# Patient Record
Sex: Female | Born: 1955 | Race: White | Hispanic: No | Smoking: Never smoker
Health system: Southern US, Community
[De-identification: ages and names within clinical notes are randomized; demographics above are authoritative.]

## PROBLEM LIST (undated history)

## (undated) DIAGNOSIS — K219 Gastro-esophageal reflux disease without esophagitis: Secondary | ICD-10-CM

## (undated) DIAGNOSIS — H81399 Other peripheral vertigo, unspecified ear: Secondary | ICD-10-CM

## (undated) DIAGNOSIS — R519 Headache, unspecified: Secondary | ICD-10-CM

## (undated) DIAGNOSIS — G250 Essential tremor: Secondary | ICD-10-CM

## (undated) DIAGNOSIS — R51 Headache: Secondary | ICD-10-CM

## (undated) DIAGNOSIS — IMO0002 Reserved for concepts with insufficient information to code with codable children: Secondary | ICD-10-CM

## (undated) HISTORY — DX: Reserved for concepts with insufficient information to code with codable children: IMO0002

## (undated) HISTORY — DX: Headache: R51

## (undated) HISTORY — DX: Headache, unspecified: R51.9

## (undated) HISTORY — DX: Essential tremor: G25.0

---

## 2000-12-16 ENCOUNTER — Emergency Department (HOSPITAL_COMMUNITY): Admission: EM | Admit: 2000-12-16 | Discharge: 2000-12-16 | Payer: Self-pay | Admitting: Emergency Medicine

## 2002-08-08 ENCOUNTER — Other Ambulatory Visit: Admission: RE | Admit: 2002-08-08 | Discharge: 2002-08-08 | Payer: Self-pay | Admitting: Obstetrics and Gynecology

## 2003-08-13 ENCOUNTER — Other Ambulatory Visit: Admission: RE | Admit: 2003-08-13 | Discharge: 2003-08-13 | Payer: Self-pay | Admitting: Obstetrics and Gynecology

## 2004-08-13 ENCOUNTER — Other Ambulatory Visit: Admission: RE | Admit: 2004-08-13 | Discharge: 2004-08-13 | Payer: Self-pay | Admitting: Obstetrics and Gynecology

## 2005-08-22 DIAGNOSIS — R87619 Unspecified abnormal cytological findings in specimens from cervix uteri: Secondary | ICD-10-CM

## 2005-08-22 DIAGNOSIS — IMO0002 Reserved for concepts with insufficient information to code with codable children: Secondary | ICD-10-CM

## 2005-08-22 HISTORY — DX: Unspecified abnormal cytological findings in specimens from cervix uteri: R87.619

## 2005-08-22 HISTORY — DX: Reserved for concepts with insufficient information to code with codable children: IMO0002

## 2005-09-06 ENCOUNTER — Other Ambulatory Visit: Admission: RE | Admit: 2005-09-06 | Discharge: 2005-09-06 | Payer: Self-pay | Admitting: Obstetrics & Gynecology

## 2006-04-10 ENCOUNTER — Other Ambulatory Visit: Admission: RE | Admit: 2006-04-10 | Discharge: 2006-04-10 | Payer: Self-pay | Admitting: Obstetrics and Gynecology

## 2006-08-22 DIAGNOSIS — G250 Essential tremor: Secondary | ICD-10-CM

## 2006-08-22 HISTORY — DX: Essential tremor: G25.0

## 2006-09-28 ENCOUNTER — Other Ambulatory Visit: Admission: RE | Admit: 2006-09-28 | Discharge: 2006-09-28 | Payer: Self-pay | Admitting: Obstetrics and Gynecology

## 2007-10-01 ENCOUNTER — Other Ambulatory Visit: Admission: RE | Admit: 2007-10-01 | Discharge: 2007-10-01 | Payer: Self-pay | Admitting: Obstetrics and Gynecology

## 2008-10-01 ENCOUNTER — Other Ambulatory Visit: Admission: RE | Admit: 2008-10-01 | Discharge: 2008-10-01 | Payer: Self-pay | Admitting: Obstetrics and Gynecology

## 2009-01-30 HISTORY — PX: COLONOSCOPY: SHX174

## 2010-11-25 ENCOUNTER — Emergency Department (INDEPENDENT_AMBULATORY_CARE_PROVIDER_SITE_OTHER): Payer: 59

## 2010-11-25 ENCOUNTER — Emergency Department (HOSPITAL_BASED_OUTPATIENT_CLINIC_OR_DEPARTMENT_OTHER)
Admission: EM | Admit: 2010-11-25 | Discharge: 2010-11-25 | Disposition: A | Discharge status: Home / Self Care | Payer: 59 | Attending: Emergency Medicine | Admitting: Emergency Medicine

## 2010-11-25 DIAGNOSIS — R42 Dizziness and giddiness: Secondary | ICD-10-CM

## 2010-11-25 DIAGNOSIS — H8110 Benign paroxysmal vertigo, unspecified ear: Secondary | ICD-10-CM | POA: Insufficient documentation

## 2010-11-25 DIAGNOSIS — R51 Headache: Secondary | ICD-10-CM | POA: Insufficient documentation

## 2010-12-06 ENCOUNTER — Other Ambulatory Visit: Payer: Self-pay | Admitting: Family Medicine

## 2010-12-06 DIAGNOSIS — M542 Cervicalgia: Secondary | ICD-10-CM

## 2010-12-08 ENCOUNTER — Ambulatory Visit
Admission: RE | Admit: 2010-12-08 | Discharge: 2010-12-08 | Disposition: A | Discharge status: Home / Self Care | Payer: 59 | Source: Ambulatory Visit | Attending: Family Medicine | Admitting: Family Medicine

## 2010-12-08 DIAGNOSIS — M542 Cervicalgia: Secondary | ICD-10-CM

## 2012-03-04 ENCOUNTER — Emergency Department (HOSPITAL_COMMUNITY): Payer: 59

## 2012-03-04 ENCOUNTER — Emergency Department (HOSPITAL_COMMUNITY)
Admission: EM | Admit: 2012-03-04 | Discharge: 2012-03-04 | Disposition: A | Discharge status: Home / Self Care | Payer: 59 | Attending: Emergency Medicine | Admitting: Emergency Medicine

## 2012-03-04 ENCOUNTER — Encounter (HOSPITAL_COMMUNITY): Payer: Self-pay | Admitting: Emergency Medicine

## 2012-03-04 DIAGNOSIS — F411 Generalized anxiety disorder: Secondary | ICD-10-CM | POA: Insufficient documentation

## 2012-03-04 DIAGNOSIS — R0602 Shortness of breath: Secondary | ICD-10-CM | POA: Insufficient documentation

## 2012-03-04 DIAGNOSIS — F419 Anxiety disorder, unspecified: Secondary | ICD-10-CM

## 2012-03-04 DIAGNOSIS — R064 Hyperventilation: Secondary | ICD-10-CM

## 2012-03-04 HISTORY — DX: Other peripheral vertigo, unspecified ear: H81.399

## 2012-03-04 LAB — POCT I-STAT TROPONIN I: Troponin i, poc: 0 ng/mL (ref 0.00–0.08)

## 2012-03-04 LAB — POCT I-STAT, CHEM 8
BUN: 17 mg/dL (ref 6–23)
Calcium, Ion: 1.15 mmol/L (ref 1.12–1.23)
Chloride: 111 meq/L (ref 96–112)
Creatinine, Ser: 0.8 mg/dL (ref 0.50–1.10)
Glucose, Bld: 97 mg/dL (ref 70–99)
HCT: 40 % (ref 36.0–46.0)
Hemoglobin: 13.6 g/dL (ref 12.0–15.0)
Potassium: 3.5 meq/L (ref 3.5–5.1)
Sodium: 142 meq/L (ref 135–145)
TCO2: 17 mmol/L (ref 0–100)

## 2012-03-04 MED ORDER — ALPRAZOLAM 0.5 MG PO TABS: 0.5000 mg | ORAL_TABLET | Freq: Three times a day (TID) | ORAL | Status: AC | PRN

## 2012-03-04 MED ORDER — LORAZEPAM 2 MG/ML IJ SOLN
1.0000 mg | Freq: Once | INTRAMUSCULAR | Status: AC
  Administered 2012-03-04: 1 mg via INTRAVENOUS
  Filled 2012-03-04: qty 1
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
## 2012-03-04 NOTE — ED Notes (Signed)
Family at bedside. 

## 2012-03-04 NOTE — ED Notes (Signed)
Patient is resting comfortably, w/no complaints, (R) hand tremors noted only when extending the hand

## 2012-03-04 NOTE — Discharge Instructions (Signed)
 Hyperventilation Syndrome Hyperventilation is fast and shallow breathing. This type of breathing may actually make you feel breathless. This type of breathing may make you think you have to catch your breath. Because of feeling like you cannot get enough air, you have been breathing more than normal (overbreathing). You are blowing off too much carbon dioxide which changes your acid-base balance. This leads to the tingling and numb feelings in your fingers, toes, and around the mouth. If this continues your fingers, hands and toes may go into spasm and curl up. You may feel as though you are going to die. Hyperventilation is usually associated with anxiety and panicky feelings. Often feelings of panic and rapid breathing become a vicious cycle. Panic leads to rapid breathing while breathing rapidly can make you feel panicky.  If you hyperventilate you may not even be aware of it. Instead you may be aware of the associated symptoms including:  Chest pains.   Palpitations.   Dizziness.   Lightheadedness.   Dry mouth.   Weakness.   Confusion.   Tingling sensations.   Sleep disturbance.  One common question is, Why did this happen to me while I was sleeping. I was not anxious. The answer is unknown. One bernadette is that it may occur during dream activity. It may disturb you more when it wakes you from a sound sleep. CAUSES  Although anxiety and/or panic attacks are some of the most common causes for the syndrome, other causes include:  Nervousness.   Stress.   Stimulant use.   Lung Disease.   Infections like pneumonia.   Heart problems.   Severe pain.   Waking from a bad dream.   Drugs or alcohol.   Pregnancy   Bleeding.  TREATMENT  Re-breathing into a paper bag (NOT plastic) or mask with a tube on it in the hospital or ambulance physically changes the carbon dioxide level. The breathing rate will slow down. The numbness and tingling will slowly go away. You might still  feel shaky.   If anxiety or panic persist, a therapist or psychiatrist may be helpful in understanding and treating the condition.   Learn breathing exercises that help you breathe from your diaphragm and abdomen.   Practice relaxation techniques such as visualization, meditation, and muscle release.  Document Released: 08/05/2000 Document Revised: 07/28/2011 Document Reviewed: 05/26/2009 [facility] Patient Information 2012 [place], [place].   Narcotic and benzodiazepine use may cause drowsiness, slowed breathing or dependence.  Please use with caution and do not drive, operate machinery or watch young children alone while taking them.  Taking combinations of these medications or drinking alcohol will potentiate these effects.

## 2012-03-04 NOTE — ED Notes (Signed)
Pt reports waking 0100 this am w/(R) arm tingling and "shaking," (L) side chest pressure, and sob. Pt denies N/V, chest congestion, cough, dizziness, headaches, or diaphoresis. Pt denies any hx of the same.

## 2012-03-04 NOTE — ED Notes (Signed)
Per EMS pt from home, pt reports waking 0100 this am w/mid-sternum chest pain and (R) arm tingling and "shaking." Pt denies sob, N/V, dizziness. Reports her pain a 4/10 w/no radiation, tender w/palpation. 20 g RH, received 25 cc NS en route, ASA 324 mg and nitro sl x1 w/no relief in chest pain, 12 lead non remarkable, 2 L [place]

## 2012-03-04 NOTE — ED Notes (Signed)
Patient transported to X-ray 

## 2013-03-04 ENCOUNTER — Encounter: Payer: Self-pay | Admitting: *Deleted

## 2013-03-07 ENCOUNTER — Ambulatory Visit (INDEPENDENT_AMBULATORY_CARE_PROVIDER_SITE_OTHER): Payer: 59 | Admitting: Nurse Practitioner

## 2013-03-07 ENCOUNTER — Encounter: Payer: Self-pay | Admitting: Nurse Practitioner

## 2013-03-07 VITALS — BP 114/70 | HR 74 | Resp 14 | Ht 62.5 in | Wt 135.6 lb

## 2013-03-07 DIAGNOSIS — Z0000 Encounter for general adult medical examination without abnormal findings: Secondary | ICD-10-CM

## 2013-03-07 DIAGNOSIS — Z01419 Encounter for gynecological examination (general) (routine) without abnormal findings: Secondary | ICD-10-CM

## 2013-03-07 MED ORDER — ESTRADIOL-NORETHINDRONE ACET 1-0.5 MG PO TABS: 1.0000 | ORAL_TABLET | Freq: Every day | ORAL | Status: DC

## 2013-03-07 NOTE — Progress Notes (Signed)
57 y.o. G66P4 Widowed Caucasian Fe here for annual exam.   Now being treated for OAB and on Toviaz and currently on Septra for UTI. Is being follower by Urologist who wants her to continue with HRT because of urinary issues. Not sexually active in years since husbands death in 02-13-04 from MI.  No LMP recorded. Patient is postmenopausal.          Sexually active: no  The current method of family planning is OCP (estrogen/progesterone).    Exercising: yes  walking Smoker:  no  Health Maintenance: Pap:  03/06/2012  Normal with negative HR HPV MMG:  07/24/2012 normal Colonoscopy:  01/30/2009 normal BMD:   never TDaP:  10/01/2008 Labs: Hgb-13.4    reports that she has never smoked. She has never used smokeless tobacco. She reports that she does not drink alcohol or use illicit drugs.  Past Medical History  Diagnosis Date  . Peripheral vertigo   . Abnormal Pap smear     CIN I  . Bilateral headaches     Past Surgical History  Procedure Laterality Date  . Benign essential tremor    . Cesarean section      x4    Current Outpatient Prescriptions  Medication Sig Dispense Refill  . ALPRAZolam (XANAX) 0.5 MG tablet Take 0.5 mg by mouth at bedtime as needed for sleep.      . norethindrone-ethinyl estradiol (JUNEL FE,GILDESS FE,LOESTRIN FE) 1-20 MG-MCG tablet Take 1 tablet by mouth daily.      Marland Kitchen sulfamethoxazole-trimethoprim (BACTRIM DS) 800-160 MG per tablet       . TOVIAZ 8 MG TB24        No current facility-administered medications for this visit.    Family History  Problem Relation Age of Onset  . Coronary artery disease Father   . Heart failure Father   . Heart failure Other   . Stroke Other     ROS:  Pertinent items are noted in HPI.  Otherwise, a comprehensive ROS was negative.  Exam:   BP 114/70  Pulse 74  Resp 14  Ht 5' 2.5" (1.588 m)  Wt 135 lb 9.6 oz (61.508 kg)  BMI 24.39 kg/m2 Height: 5' 2.5" (158.8 cm)  Ht Readings from Last 3 Encounters:  03/07/13 5' 2.5"  (1.588 m)    General appearance: alert, cooperative and appears stated age Head: Normocephalic, without obvious abnormality, atraumatic Neck: no adenopathy, supple, symmetrical, trachea midline and thyroid normal to inspection and palpation Lungs: clear to auscultation bilaterally Breasts: normal appearance, no masses or tenderness Heart: regular rate and rhythm Abdomen: soft, non-tender; no masses,  no organomegaly Extremities: extremities normal, atraumatic, no cyanosis or edema Skin: Skin color, texture, turgor normal. No rashes or lesions Lymph nodes: Cervical, supraclavicular, and axillary nodes normal. No abnormal inguinal nodes palpated Neurologic: Grossly normal   Pelvic: External genitalia:  no lesions              Urethra:  normal appearing urethra with no masses, tenderness or lesions              Bartholin's and Skene's: normal                 Vagina: normal appearing vagina with normal color and discharge, no lesions              Cervix: anteverted              Pap taken: no Bimanual Exam:  Uterus:  normal size, contour, position,  consistency, mobility, non-tender              Adnexa: no mass, fullness, tenderness               Rectovaginal: Confirms               Anus:  normal sphincter tone, no lesions  A:  Well Woman with normal exam  Postmenopausal on HRT  OAB with current UTI - Urologist is following   P:   Pap smear as per guidelines   Mammogram due 07/2013  Refill HRT Activella for 1 year Rx printed because she has a new mail order  company and doesn't know the name.  Reviewed potentia risk including DVT, CVA,cancer, etc  Routine labs and follow  counseled on use and side effects of HRT, adequate intake of calcium and  vitamin D, diet and exercise, Kegel's exercises return annually or prn  An After Visit Summary was printed and given to the patient.

## 2013-03-07 NOTE — Patient Instructions (Addendum)

## 2013-03-08 LAB — COMPREHENSIVE METABOLIC PANEL
ALT: 10 U/L (ref 0–35)
Alkaline Phosphatase: 48 U/L (ref 39–117)
Creat: 1.12 mg/dL — ABNORMAL HIGH (ref 0.50–1.10)
Glucose, Bld: 94 mg/dL (ref 70–99)
Sodium: 139 mEq/L (ref 135–145)
Total Bilirubin: 0.4 mg/dL (ref 0.3–1.2)
Total Protein: 6.6 g/dL (ref 6.0–8.3)

## 2013-03-08 LAB — LIPID PANEL
HDL: 44 mg/dL (ref >39)
LDL Cholesterol: 109 mg/dL — ABNORMAL HIGH (ref 0–99)
Total CHOL/HDL Ratio: 3.9 Ratio
Triglycerides: 85 mg/dL (ref <150)

## 2013-03-08 LAB — TSH: TSH: 2.675 u[IU]/mL (ref 0.350–4.500)

## 2013-03-08 NOTE — Progress Notes (Signed)
Encounter reviewed by Dr. Senovia Gauer Silva.  

## 2013-03-11 ENCOUNTER — Telehealth: Payer: Self-pay | Admitting: *Deleted

## 2013-03-11 NOTE — Telephone Encounter (Signed)
Pt is aware of all lab results and will discontinue vitamin D supplements.

## 2013-03-11 NOTE — Telephone Encounter (Signed)
Message copied by Osie Bond on Mon Mar 11, 2013  8:51 AM ------      Message from: Ria Comment R      Created: Mon Mar 11, 2013  8:36 AM       Let patient know that labs are normal. Vit D is elevated at 72 needs to come off Vit D for the summer if she is taking. TSH is normal ------

## 2014-03-11 ENCOUNTER — Ambulatory Visit (INDEPENDENT_AMBULATORY_CARE_PROVIDER_SITE_OTHER): Payer: 59 | Admitting: Nurse Practitioner

## 2014-03-11 ENCOUNTER — Encounter: Payer: Self-pay | Admitting: Nurse Practitioner

## 2014-03-11 VITALS — BP 120/70 | HR 64 | Resp 18 | Ht 62.75 in | Wt 134.0 lb

## 2014-03-11 DIAGNOSIS — R8290 Unspecified abnormal findings in urine: Secondary | ICD-10-CM

## 2014-03-11 DIAGNOSIS — Z1211 Encounter for screening for malignant neoplasm of colon: Secondary | ICD-10-CM

## 2014-03-11 DIAGNOSIS — E2839 Other primary ovarian failure: Secondary | ICD-10-CM

## 2014-03-11 DIAGNOSIS — Z01419 Encounter for gynecological examination (general) (routine) without abnormal findings: Secondary | ICD-10-CM

## 2014-03-11 DIAGNOSIS — L578 Other skin changes due to chronic exposure to nonionizing radiation: Secondary | ICD-10-CM

## 2014-03-11 DIAGNOSIS — Z0000 Encounter for general adult medical examination without abnormal findings: Secondary | ICD-10-CM

## 2014-03-11 DIAGNOSIS — R82998 Other abnormal findings in urine: Secondary | ICD-10-CM

## 2014-03-11 LAB — POCT URINALYSIS DIPSTICK
Bilirubin, UA: NEGATIVE
Glucose, UA: NEGATIVE
KETONES UA: NEGATIVE
Nitrite, UA: NEGATIVE
PH UA: 5
PROTEIN UA: NEGATIVE
Urobilinogen, UA: NEGATIVE

## 2014-03-11 LAB — HEMOGLOBIN, FINGERSTICK: Hemoglobin, fingerstick: 13.8 g/dL (ref 12.0–16.0)

## 2014-03-11 MED ORDER — FLUCONAZOLE 150 MG PO TABS: 150.0000 mg | ORAL_TABLET | Freq: Once | ORAL | Status: DC

## 2014-03-11 MED ORDER — ESTRADIOL-NORETHINDRONE ACET 1-0.5 MG PO TABS: 1.0000 | ORAL_TABLET | Freq: Every day | ORAL | Status: DC

## 2014-03-11 NOTE — Patient Instructions (Signed)
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
## 2014-03-12 LAB — URINALYSIS, MICROSCOPIC ONLY: CASTS: NONE SEEN

## 2014-03-12 LAB — URINE CULTURE: Colony Count: >=100000

## 2014-03-13 ENCOUNTER — Other Ambulatory Visit: Payer: Self-pay | Admitting: Nurse Practitioner

## 2014-03-14 ENCOUNTER — Telehealth: Payer: Self-pay

## 2014-03-14 NOTE — Telephone Encounter (Signed)
Message copied by Brooks SailorsHARGROVE, Donnis Phaneuf S on Fri Mar 14, 2014 10:03 AM ------      Message from: Verner CholLEONARD, DEBORAH S      Created: Fri Mar 14, 2014  8:40 AM       Notify patient that urine micro showed very few bacteria and feel related to yeast in vagina which you were given medication for. Urine micro showed multibacteria non predominant to treat. If UTI symptoms occur need to notify. Needs repeat micro and culture in one week after Diflucan use. Order in. ------

## 2014-03-14 NOTE — Telephone Encounter (Signed)
Called pt but no vm or no answer Will try later

## 2014-03-14 NOTE — Telephone Encounter (Signed)
Last AEX; 03/11/14 Last refill:03/11/14 #90, 3 rf Current AEX:03/13/15  Pt was given a rx on 03/11/14

## 2014-03-17 MED ORDER — ESTRADIOL-NORETHINDRONE ACET 1-0.5 MG PO TABS: 1.0000 | ORAL_TABLET | Freq: Every day | ORAL | Status: DC

## 2014-03-17 NOTE — Telephone Encounter (Signed)
S/W pharmacy and she stated she never received the rx.  I'm resending the rx.

## 2014-03-17 NOTE — Telephone Encounter (Signed)
Pt informed of results below. Pt stated that pharmacy needed more info about rx that was sent on 03/11/14

## 2014-03-20 NOTE — Progress Notes (Signed)
Encounter reviewed by Dr. Brook Silva.  

## 2014-04-04 LAB — FECAL OCCULT BLOOD, IMMUNOCHEMICAL: IMMUNOLOGICAL FECAL OCCULT BLOOD TEST: NEGATIVE

## 2014-04-04 NOTE — Addendum Note (Signed)
Addended by: Luisa DagoPHILLIPS, STEPHANIE C on: 04/04/2014 10:33 AM   Modules accepted: Orders

## 2014-04-07 ENCOUNTER — Telehealth: Payer: Self-pay

## 2014-04-07 NOTE — Telephone Encounter (Signed)
Pt informed of results and voiced understanding

## 2014-04-07 NOTE — Telephone Encounter (Signed)
Message copied by Brooks SailorsHARGROVE, Tashi Andujo S on Mon Apr 07, 2014 11:12 AM ------      Message from: Verner CholLEONARD, DEBORAH S      Created: Fri Apr 04, 2014  3:04 PM       DL for PG      Notify fecal occult blood test negative ------

## 2014-04-17 ENCOUNTER — Other Ambulatory Visit: Payer: Self-pay

## 2014-04-17 NOTE — Telephone Encounter (Signed)
Fax from [facility] Last AEX: 721/15 Last refill:03/17/14 #90 X 3 Current AEX:03/13/15  New Rx sent 03/11/14

## 2014-04-21 ENCOUNTER — Other Ambulatory Visit: Payer: Self-pay

## 2014-04-21 ENCOUNTER — Telehealth: Payer: Self-pay | Admitting: Nurse Practitioner

## 2014-04-21 MED ORDER — ESTRADIOL-NORETHINDRONE ACET 1-0.5 MG PO TABS: 1.0000 | ORAL_TABLET | Freq: Every day | ORAL | Status: DC

## 2014-04-21 NOTE — Telephone Encounter (Signed)
Ronke from [employer] from regarding pt's Rx for Activella.

## 2014-04-21 NOTE — Telephone Encounter (Signed)
Received a fax from [facility]. They stated they did not get the rx sent on 03/17/14. Resent rx to [facility]. Encounter closed

## 2014-04-21 NOTE — Telephone Encounter (Signed)
Spoke with pharmacist at [facility] and advised that new rx was faxed.  She will process rx.  Routing to provider for final review. Patient agreeable to disposition. Will close encounter

## 2014-06-23 ENCOUNTER — Encounter: Payer: Self-pay | Admitting: Nurse Practitioner

## 2015-03-13 ENCOUNTER — Ambulatory Visit: Payer: 59 | Admitting: Nurse Practitioner

## 2015-03-16 ENCOUNTER — Encounter: Payer: Self-pay | Admitting: Nurse Practitioner

## 2015-03-16 ENCOUNTER — Ambulatory Visit (INDEPENDENT_AMBULATORY_CARE_PROVIDER_SITE_OTHER): Payer: 59 | Admitting: Nurse Practitioner

## 2015-03-16 VITALS — BP 112/64 | HR 68 | Ht 62.75 in | Wt 137.0 lb

## 2015-03-16 DIAGNOSIS — Z0000 Encounter for general adult medical examination without abnormal findings: Secondary | ICD-10-CM

## 2015-03-16 DIAGNOSIS — E7800 Pure hypercholesterolemia, unspecified: Secondary | ICD-10-CM

## 2015-03-16 DIAGNOSIS — Z1211 Encounter for screening for malignant neoplasm of colon: Secondary | ICD-10-CM | POA: Diagnosis not present

## 2015-03-16 DIAGNOSIS — B372 Candidiasis of skin and nail: Secondary | ICD-10-CM | POA: Diagnosis not present

## 2015-03-16 DIAGNOSIS — Z01419 Encounter for gynecological examination (general) (routine) without abnormal findings: Secondary | ICD-10-CM | POA: Diagnosis not present

## 2015-03-16 DIAGNOSIS — N390 Urinary tract infection, site not specified: Secondary | ICD-10-CM

## 2015-03-16 LAB — HEMOGLOBIN, FINGERSTICK: Hemoglobin, fingerstick: 13.9 g/dL (ref 12.0–16.0)

## 2015-03-16 MED ORDER — FLUCONAZOLE 150 MG PO TABS: ORAL_TABLET | ORAL | Status: DC

## 2015-03-16 NOTE — Patient Instructions (Signed)

## 2015-03-16 NOTE — Progress Notes (Signed)
Patient ID: Kristi Gamble, female   DOB: 09-30-1955, 59 y.o.   MRN: [id number] 59 y.o. G4P4003 Widowed  Caucasian Fe here for annual exam. Currently being treated for     UTI since Friday.  She is now on Cipro, culture was done and should be called soon.  She states only feels a little better - maybe related to the antibiotic or the dose is not strong enough.  No history of renal calculi.  Patient's last menstrual period was 06/22/2008 (approximate).          Sexually active: No. not since husbands death in 01-17-2004 The current method of family planning is abstinence and post menopausal status.    Exercising: Yes.    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 BMD: N/A TDaP: 10/01/08 Labs:  HB:  13.9  Urine:  Not tested, pt is currently being treated for UTI   reports that she has never smoked. She has never used smokeless tobacco. She reports that she does not drink alcohol or use illicit drugs.  Past Medical History  Diagnosis Date  . Peripheral vertigo   . Abnormal Pap smear 08/2005    CIN I  . Bilateral headaches   . Benign essential tremor 01-17-2007    Past Surgical History  Procedure Laterality Date  . Cesarean section      x4  . Colonoscopy  01/30/09    normal recxheck in 10 years    Current Outpatient Prescriptions  Medication Sig Dispense Refill  . ciprofloxacin (CIPRO) 250 MG tablet Take 1 tablet by mouth 2 (two) times daily.    . fluconazole (DIFLUCAN) 150 MG tablet Take one tablet and Repeat in 48 hours.  Then weekly X 4 6 tablet 0   No current facility-administered medications for this visit.    Family History  Problem Relation Age of Onset  . Coronary artery disease Father   . Heart failure Father   . Heart failure Maternal  Grandmother   . Stroke Paternal Grandfather     ROS:  Pertinent items are noted in HPI.  Otherwise, a comprehensive ROS was negative.  Exam:   BP 112/64 mmHg  Pulse 68  Ht 5' 2.75" (1.594 m)  Wt 137 lb (62.143 kg)  BMI 24.46 kg/m2  LMP 06/22/2008 (Approximate) Height: 5' 2.75" (159.4 cm) Ht Readings from Last 3 Encounters:  03/16/15 5' 2.75" (1.594 m)  03/11/14 5' 2.75" (1.594 m)  03/07/13 5' 2.5" (1.588 m)    General appearance: alert, cooperative and appears stated age Head: Normocephalic, without obvious abnormality, atraumatic Neck: no adenopathy, supple, symmetrical, trachea midline and thyroid normal to inspection and palpation Lungs: clear to auscultation bilaterally Breasts: normal appearance, no masses or tenderness Heart: regular rate and rhythm Abdomen: soft, non-tender; no masses,  no organomegaly Extremities: extremities normal, atraumatic, no cyanosis or edema Skin: Skin color, texture, turgor normal. No rashes or lesions - skin is sun damaged Lymph nodes: Cervical, supraclavicular, and axillary nodes normal. No abnormal inguinal nodes palpated Neurologic: Grossly normal   Pelvic: External genitalia:  Chronic irritation of vulvar yeast extending to the rectum              Urethra:  normal appearing urethra with no masses, tenderness or lesions  Bartholin's and Skene's: normal                 Vagina: normal appearing vagina with normal color and discharge, no lesions              Cervix: anteverted              Pap taken: Yes.   Bimanual Exam:  Uterus:  normal size, contour, position, consistency, mobility, non-tender              Adnexa: no mass, fullness, tenderness               Rectovaginal: Confirms               Anus:  normal sphincter tone, no lesions        Chaperone present:  yes  A:  Well Woman with normal exam  Postmenopausal on HRT Not SA Current  UTI  Sun damaged skin   Remote History of CIN I 2007  External chronic yeast- due to out of doors working most days   P:   Reviewed health and wellness pertinent to exam  Pap smear as above  Mammogram is due 12/2015  Will give her Diflucan 150 mg X 2 initially , then follow with 1 tablet weekly X 4 to treat this yeast infection.  Will  return for fasting labs, IFOB is given  Counseled on breast self exam, mammography screening, adequate intake of calcium and vitamin D, diet and exercise, Kegel's exercises return annually or prn  An After Visit Summary was printed and given to the patient.

## 2015-03-19 LAB — IPS PAP TEST WITH HPV

## 2015-03-21 NOTE — Progress Notes (Signed)
Encounter reviewed by Dr. Brook Amundson C. Silva.  

## 2015-03-24 ENCOUNTER — Other Ambulatory Visit (INDEPENDENT_AMBULATORY_CARE_PROVIDER_SITE_OTHER): Payer: 59

## 2015-03-24 DIAGNOSIS — Z0000 Encounter for general adult medical examination without abnormal findings: Secondary | ICD-10-CM

## 2015-03-25 LAB — CBC WITH DIFFERENTIAL/PLATELET
BASOS PCT: 1 % (ref 0–1)
Basophils Absolute: 0 10*3/uL (ref 0.0–0.1)
EOS ABS: 0.1 10*3/uL (ref 0.0–0.7)
EOS PCT: 3 % (ref 0–5)
HCT: 43.1 % (ref 36.0–46.0)
HEMOGLOBIN: 14.5 g/dL (ref 12.0–15.0)
LYMPHS ABS: 1.6 10*3/uL (ref 0.7–4.0)
Lymphocytes Relative: 34 % (ref 12–46)
MCH: 31.9 pg (ref 26.0–34.0)
MCHC: 33.6 g/dL (ref 30.0–36.0)
MCV: 94.7 fL (ref 78.0–100.0)
MPV: 9.5 fL (ref 8.6–12.4)
Monocytes Absolute: 0.3 10*3/uL (ref 0.1–1.0)
Monocytes Relative: 7 % (ref 3–12)
Neutro Abs: 2.6 10*3/uL (ref 1.7–7.7)
Neutrophils Relative %: 55 % (ref 43–77)
Platelets: 303 10*3/uL (ref 150–400)
RBC: 4.55 MIL/uL (ref 3.87–5.11)
RDW: 13.8 % (ref 11.5–15.5)
WBC: 4.7 10*3/uL (ref 4.0–10.5)

## 2015-03-25 LAB — TSH: TSH: 2.463 u[IU]/mL (ref 0.350–4.500)

## 2015-03-25 LAB — COMPREHENSIVE METABOLIC PANEL
ALT: 13 U/L (ref 6–29)
AST: 16 U/L (ref 10–35)
Albumin: 4 g/dL (ref 3.6–5.1)
Alkaline Phosphatase: 50 U/L (ref 33–130)
BUN: 21 mg/dL (ref 7–25)
CO2: 24 mmol/L (ref 20–31)
Calcium: 9.6 mg/dL (ref 8.6–10.4)
Chloride: 105 mmol/L (ref 98–110)
Creat: 0.87 mg/dL (ref 0.50–0.99)
GLUCOSE: 86 mg/dL (ref 65–99)
Potassium: 4.3 mmol/L (ref 3.5–5.3)
Sodium: 141 mmol/L (ref 135–146)
TOTAL PROTEIN: 7.1 g/dL (ref 6.1–8.1)
Total Bilirubin: 0.5 mg/dL (ref 0.2–1.2)

## 2015-03-25 LAB — LIPID PANEL
CHOL/HDL RATIO: 3.6 ratio (ref <=5.0)
Cholesterol: 178 mg/dL (ref 125–200)
HDL: 50 mg/dL (ref >=46)
LDL CALC: 110 mg/dL (ref <130)
TRIGLYCERIDES: 92 mg/dL (ref <150)
VLDL: 18 mg/dL (ref <30)

## 2015-03-25 LAB — VITAMIN D 25 HYDROXY (VIT D DEFICIENCY, FRACTURES): Vit D, 25-Hydroxy: 60 ng/mL (ref 30–100)

## 2016-02-16 ENCOUNTER — Encounter: Payer: Self-pay | Admitting: Nurse Practitioner

## 2016-03-18 ENCOUNTER — Ambulatory Visit (INDEPENDENT_AMBULATORY_CARE_PROVIDER_SITE_OTHER): Payer: BLUE CROSS/BLUE SHIELD | Admitting: Nurse Practitioner

## 2016-03-18 ENCOUNTER — Encounter: Payer: Self-pay | Admitting: Nurse Practitioner

## 2016-03-18 VITALS — BP 114/72 | HR 64 | Ht 62.75 in | Wt 142.0 lb

## 2016-03-18 DIAGNOSIS — N760 Acute vaginitis: Secondary | ICD-10-CM

## 2016-03-18 DIAGNOSIS — Z0000 Encounter for general adult medical examination without abnormal findings: Secondary | ICD-10-CM

## 2016-03-18 DIAGNOSIS — Z79890 Hormone replacement therapy (postmenopausal): Secondary | ICD-10-CM

## 2016-03-18 DIAGNOSIS — Z01419 Encounter for gynecological examination (general) (routine) without abnormal findings: Secondary | ICD-10-CM | POA: Diagnosis not present

## 2016-03-18 DIAGNOSIS — Z1211 Encounter for screening for malignant neoplasm of colon: Secondary | ICD-10-CM

## 2016-03-18 MED ORDER — FLUCONAZOLE 150 MG PO TABS: 150.0000 mg | ORAL_TABLET | Freq: Once | ORAL | 1 refills | Status: AC

## 2016-03-18 MED ORDER — ESTRADIOL-NORETHINDRONE ACET 1-0.5 MG PO TABS: 1.0000 | ORAL_TABLET | Freq: Every day | ORAL | 1 refills | Status: DC

## 2016-03-18 NOTE — Progress Notes (Signed)
60 y.o. G21P4003 Widowed  Caucasian Fe here for annual exam.  Off HRT since last fall.  Initially did OK but now not so good.  Has had increase in vaso symptoms that are "unbearable" at times.  Some insomnia and has had increase in weight.  She wants to go back on HRT even for a short time.  She is still working and is now looking into being able to collect as a widows pension.  She still takes care of about 16 acres of land at her home.  By working out side she feels she has another yeast infection.  Patient's last menstrual period was 06/22/2008 (approximate).          Sexually active: No. not since death of her husband The current method of family planning is none.    Exercising: Yes.    walking daily Smoker:  no  Health Maintenance: Pap:  03/16/15, Negative with neg HR HPV. CIN I 2007 MMG:  02/02/16, Bi-Rads 1:  Negative Colonoscopy: 01/2009 Normal repeat in 10 years BMD: none TDaP:  10/01/2008 Shingles: never Hep C and HIV: will do with other labs fasting Labs: will return for fasting labs   reports that she has never smoked. She has never used smokeless tobacco. She reports that she does not drink alcohol or use drugs.  Past Medical History:  Diagnosis Date  . 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 Benign essential tremor 2008  . Bilateral headaches   . Peripheral vertigo     Past Surgical History:  Procedure Laterality Date  . CESAREAN SECTION     x4  . COLONOSCOPY  01/30/09   normal recxheck in 10 years    No current outpatient prescriptions on file.   No current facility-administered medications for this visit.     Family History  Problem Relation Age of Onset  . Coronary artery disease Father   . Heart failure Father   . Stroke Paternal Grandfather   . Heart failure Maternal Grandmother     ROS:  Pertinent items are noted in HPI.  Otherwise, a comprehensive ROS was negative.  Exam:   BP 114/72 (BP Location: Right Arm, Patient Position: Sitting, Cuff Size:  Normal)   Pulse 64   Ht 5' 2.75" (1.594 m)   Wt 142 lb (64.4 kg)   LMP 06/22/2008 (Approximate)   BMI 25.36 kg/m  Height: 5' 2.75" (159.4 cm) Ht Readings from Last 3 Encounters:  03/18/16 5' 2.75" (1.594 m)  03/16/15 5' 2.75" (1.594 m)  03/11/14 5' 2.75" (1.594 m)    General appearance: alert, cooperative and appears stated age Head: Normocephalic, without obvious abnormality, atraumatic Neck: no adenopathy, supple, symmetrical, trachea midline and thyroid normal to inspection and palpation Lungs: clear to auscultation bilaterally Breasts: normal appearance, no masses or tenderness Heart: regular rate and rhythm Abdomen: soft, non-tender; no masses,  no organomegaly Extremities: extremities normal, atraumatic, no cyanosis or edema Skin: Skin color, texture, turgor normal. No rashes or lesions Lymph nodes: Cervical, supraclavicular, and axillary nodes normal. No abnormal inguinal nodes palpated Neurologic: Grossly normal   Pelvic: External genitalia:  no lesions, but external yeast symptoms that extend to the perianal region.              Urethra:  normal appearing urethra with no masses, tenderness or lesions              Bartholin's and Skene's: normal  Vagina: normal appearing vagina with normal color and discharge, no lesions              Cervix: anteverted              Pap taken: No. Bimanual Exam:  Uterus:  normal size, contour, position, consistency, mobility, non-tender              Adnexa: no mass, fullness, tenderness               Rectovaginal: Confirms               Anus:  normal sphincter tone, no lesions  Chaperone present: yes  A:  Well Woman with normal exam  Postmenopausal - tapered off HRT - wants a restart Not SA Sun damaged skin  Remote History of CIN I 2007             External chronic yeast- due to out of doors working most days     P:   Reviewed health and wellness pertinent to exam  Pap  smear not done  Mammogram is due 01/2017  Will start her back on Activella that she took before - she is given RX for 6 months and may try to taper before then.  If she is unable to taper then may need a refill later.  Counseled of risk of DVT, CVA, cancer, etc.  Will follow with Affirm - she is given Diflucan for now  Counseled on breast self exam, mammography screening, use and side effects of HRT, adequate intake of calcium and vitamin D, diet and exercise, Kegel's exercises return annually or prn  An After Visit Summary was printed and given to the patient.

## 2016-03-18 NOTE — Patient Instructions (Addendum)

## 2016-03-19 LAB — WET PREP BY MOLECULAR PROBE
CANDIDA SPECIES: NEGATIVE
GARDNERELLA VAGINALIS: NEGATIVE
TRICHOMONAS VAG: NEGATIVE

## 2016-03-22 ENCOUNTER — Other Ambulatory Visit (INDEPENDENT_AMBULATORY_CARE_PROVIDER_SITE_OTHER): Payer: BLUE CROSS/BLUE SHIELD

## 2016-03-22 DIAGNOSIS — Z0000 Encounter for general adult medical examination without abnormal findings: Secondary | ICD-10-CM

## 2016-03-22 LAB — CBC WITH DIFFERENTIAL/PLATELET
BASOS PCT: 1 %
Basophils Absolute: 59 cells/uL (ref 0–200)
EOS PCT: 3 %
Eosinophils Absolute: 177 cells/uL (ref 15–500)
HEMATOCRIT: 42.5 % (ref 35.0–45.0)
HEMOGLOBIN: 14.4 g/dL (ref 11.7–15.5)
LYMPHS ABS: 1534 {cells}/uL (ref 850–3900)
Lymphocytes Relative: 26 %
MCH: 32 pg (ref 27.0–33.0)
MCHC: 33.9 g/dL (ref 32.0–36.0)
MCV: 94.4 fL (ref 80.0–100.0)
MONO ABS: 413 {cells}/uL (ref 200–950)
MPV: 9.5 fL (ref 7.5–12.5)
Monocytes Relative: 7 %
Neutro Abs: 3717 cells/uL (ref 1500–7800)
Neutrophils Relative %: 63 %
Platelets: 304 10*3/uL (ref 140–400)
RBC: 4.5 MIL/uL (ref 3.80–5.10)
RDW: 14 % (ref 11.0–15.0)
WBC: 5.9 10*3/uL (ref 3.8–10.8)

## 2016-03-22 LAB — COMPREHENSIVE METABOLIC PANEL
ALBUMIN: 4.4 g/dL (ref 3.6–5.1)
ALK PHOS: 68 U/L (ref 33–130)
ALT: 9 U/L (ref 6–29)
AST: 15 U/L (ref 10–35)
BUN: 16 mg/dL (ref 7–25)
CALCIUM: 9.7 mg/dL (ref 8.6–10.4)
CHLORIDE: 105 mmol/L (ref 98–110)
CO2: 22 mmol/L (ref 20–31)
Creat: 0.99 mg/dL (ref 0.50–0.99)
Glucose, Bld: 92 mg/dL (ref 65–99)
POTASSIUM: 4.2 mmol/L (ref 3.5–5.3)
Sodium: 140 mmol/L (ref 135–146)
TOTAL PROTEIN: 7.4 g/dL (ref 6.1–8.1)
Total Bilirubin: 0.4 mg/dL (ref 0.2–1.2)

## 2016-03-22 LAB — TSH: TSH: 3.74 mIU/L

## 2016-03-22 LAB — LIPID PANEL
CHOL/HDL RATIO: 3.5 ratio (ref <=5.0)
CHOLESTEROL: 183 mg/dL (ref 125–200)
HDL: 52 mg/dL (ref >=46)
LDL Cholesterol: 112 mg/dL (ref <130)
TRIGLYCERIDES: 94 mg/dL (ref <150)
VLDL: 19 mg/dL (ref <30)

## 2016-03-23 LAB — VITAMIN D 25 HYDROXY (VIT D DEFICIENCY, FRACTURES): Vit D, 25-Hydroxy: 66 ng/mL (ref 30–100)

## 2016-03-23 LAB — HIV ANTIBODY (ROUTINE TESTING W REFLEX): HIV: NONREACTIVE

## 2016-03-23 LAB — HEPATITIS C ANTIBODY: HCV AB: NEGATIVE

## 2016-03-23 NOTE — Addendum Note (Signed)
Addended by: Roanna Banning on: 03/23/2016 05:47 PM   Modules accepted: Orders

## 2016-03-28 LAB — FECAL OCCULT BLOOD, IMMUNOCHEMICAL: IMMUNOLOGICAL FECAL OCCULT BLOOD TEST: NEGATIVE

## 2016-03-28 NOTE — Addendum Note (Signed)
Addended by: Michell Heinrich'NEAL, SUMMER D on: 03/28/2016 09:53 AM   Modules accepted: Orders

## 2016-09-08 ENCOUNTER — Other Ambulatory Visit: Payer: Self-pay | Admitting: Nurse Practitioner

## 2016-09-09 NOTE — Telephone Encounter (Signed)
Medication refill request: Estradiol-Norethindrone Last AEX:  03/18/16 PG Next AEX: 03/20/17 PG Last MMG (if hormonal medication request): 02/02/16 BIRADS1, Density C, [facility] Refill authorized: 03/18/16 #90 1R. Please advise. Thank you.

## 2017-02-23 ENCOUNTER — Encounter: Payer: Self-pay | Admitting: Nurse Practitioner

## 2017-03-20 ENCOUNTER — Ambulatory Visit: Payer: BLUE CROSS/BLUE SHIELD | Admitting: Nurse Practitioner

## 2017-03-22 ENCOUNTER — Encounter: Payer: Self-pay | Admitting: Certified Nurse Midwife

## 2017-03-22 ENCOUNTER — Ambulatory Visit (INDEPENDENT_AMBULATORY_CARE_PROVIDER_SITE_OTHER): Payer: BLUE CROSS/BLUE SHIELD | Admitting: Certified Nurse Midwife

## 2017-03-22 ENCOUNTER — Other Ambulatory Visit (HOSPITAL_COMMUNITY)
Admission: RE | Admit: 2017-03-22 | Discharge: 2017-03-22 | Disposition: A | Discharge status: Home / Self Care | Payer: BLUE CROSS/BLUE SHIELD | Source: Ambulatory Visit | Attending: Obstetrics & Gynecology | Admitting: Obstetrics & Gynecology

## 2017-03-22 VITALS — BP 108/64 | HR 70 | Resp 16 | Ht 62.5 in | Wt 135.0 lb

## 2017-03-22 DIAGNOSIS — Z01419 Encounter for gynecological examination (general) (routine) without abnormal findings: Secondary | ICD-10-CM

## 2017-03-22 DIAGNOSIS — Z124 Encounter for screening for malignant neoplasm of cervix: Secondary | ICD-10-CM | POA: Diagnosis not present

## 2017-03-22 DIAGNOSIS — Z0000 Encounter for general adult medical examination without abnormal findings: Secondary | ICD-10-CM

## 2017-03-22 NOTE — Patient Instructions (Signed)

## 2017-03-22 NOTE — Progress Notes (Signed)
61 y.o. [id number] Widowed  Caucasian Fe here for annual exam. Menopausal no HRT. Denies vaginal bleeding or vaginal dryness. Has been staying active and watching diet. Has lost 5 pounds. Sees Dr Zonia KiefStephens as needed. Requests screening labs for insurance at work. Has noted slight rash on buttocks from "sweating" works outside all the time. Has tried creams to help, but no change. No other health issues today. Has 5 grandchildren!  Patient's last menstrual period was 06/22/2008 (approximate).          Sexually active: No.  The current method of family planning is tubal ligation.    Exercising: Yes.    walk Smoker:  no  Health Maintenance: Pap:  2016 neg HPV HR neg History of Abnormal Pap: CIN1 MMG:  02-08-17 category c density birads 2:neg Self Breast exams: yes Colonoscopy:  2010 normal f/u 1379yrs BMD:   2018 normal repeat 3 years TDaP:  2010 Shingles: none Pneumonia: none Hep C and HIV: both neg 2017 Labs: yes   reports that she has never smoked. She has never used smokeless tobacco. She reports that she does not drink alcohol or use drugs.  Past Medical History:  Diagnosis Date  . 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 Benign essential tremor 2008  . Bilateral headaches   . Peripheral vertigo     Past Surgical History:  Procedure Laterality Date  . CESAREAN SECTION     x4  . COLONOSCOPY  01/30/09   normal recxheck in 10 years    No current outpatient prescriptions on file.   No current facility-administered medications for this visit.     Family History  Problem Relation Age of Onset  . Coronary artery disease Father   . Heart failure Father   . Stroke Paternal Grandfather   . Heart failure Maternal Grandmother     ROS:  Pertinent items are noted in HPI.  Otherwise, a comprehensive ROS was negative.  Exam:   BP 108/64   Pulse 70   Resp 16   Ht 5' 2.5" (1.588 m)   Wt 135 lb (61.2 kg)   LMP 06/22/2008 (Approximate)   BMI 24.30 kg/m  Height: 5' 2.5" (158.8 cm) Ht  Readings from Last 3 Encounters:  03/22/17 5' 2.5" (1.588 m)  03/18/16 5' 2.75" (1.594 m)  03/16/15 5' 2.75" (1.594 m)    General appearance: alert, cooperative and appears stated age Head: Normocephalic, without obvious abnormality, atraumatic Neck: no adenopathy, supple, symmetrical, trachea midline and thyroid normal to inspection and palpation Lungs: clear to auscultation bilaterally Breasts: normal appearance, no masses or tenderness, No nipple retraction or dimpling, No nipple discharge or bleeding, No axillary or supraclavicular adenopathy Heart: regular rate and rhythm Abdomen: soft, non-tender; no masses,  no organomegaly Extremities: extremities normal, atraumatic, no cyanosis or edema Skin: Skin color, texture, turgor normal. No rashes or lesions Lymph nodes: Cervical, supraclavicular, and axillary nodes normal. No abnormal inguinal nodes palpated Neurologic: Grossly normal   Pelvic: External genitalia:  no lesions, normal female, heat rash noted on perineal area, no weeping or papules noted              Urethra:  normal appearing urethra with no masses, tenderness or lesions              Bartholin's and Skene's: normal                 Vagina: normal appearing vagina with normal color and discharge, no lesions  Cervix: no cervical motion tenderness, no lesions and nulliparous appearance              Pap taken: Yes.   Bimanual Exam:  Uterus:  normal size, contour, position, consistency, mobility, non-tender              Adnexa: normal adnexa and no mass, fullness, tenderness               Rectovaginal: Confirms               Anus:  normal sphincter tone, no lesions  Chaperone present: yes  A:  Well Woman with normal exam  Menopausal no HRT  Heat rash on perineal area  History of benign essential tremor, no change  Screening labs  P:   Reviewed health and wellness pertinent to exam  Aware of need to evaluate if vaginal bleeding  Discussed finding and  suggested Balmex after bathing and before dressing for work, this should help protect skin. Advise if not helping.  Labs: CMP,Lipid panel, CBC, TSH, Vitamin D  Pap smear: yes   counseled on breast self exam, mammography screening, feminine hygiene, menopause, adequate intake of calcium and vitamin D, diet and exercise  return annually or prn  An After Visit Summary was printed and given to the patient.

## 2017-03-23 ENCOUNTER — Telehealth: Payer: Self-pay

## 2017-03-23 LAB — VITAMIN D 25 HYDROXY (VIT D DEFICIENCY, FRACTURES): VIT D 25 HYDROXY: 52.8 ng/mL (ref 30.0–100.0)

## 2017-03-23 LAB — COMPREHENSIVE METABOLIC PANEL
A/G RATIO: 1.8 (ref 1.2–2.2)
ALK PHOS: 55 IU/L (ref 39–117)
ALT: 14 IU/L (ref 0–32)
AST: 18 IU/L (ref 0–40)
Albumin: 4.2 g/dL (ref 3.6–4.8)
BILIRUBIN TOTAL: 0.4 mg/dL (ref 0.0–1.2)
BUN/Creatinine Ratio: 19 (ref 12–28)
BUN: 16 mg/dL (ref 8–27)
CALCIUM: 9.5 mg/dL (ref 8.7–10.3)
CHLORIDE: 106 mmol/L (ref 96–106)
CO2: 22 mmol/L (ref 20–29)
Creatinine, Ser: 0.84 mg/dL (ref 0.57–1.00)
GFR calc Af Amer: 86 mL/min/{1.73_m2} (ref >59)
GFR calc non Af Amer: 74 mL/min/{1.73_m2} (ref >59)
GLOBULIN, TOTAL: 2.3 g/dL (ref 1.5–4.5)
Glucose: 89 mg/dL (ref 65–99)
POTASSIUM: 4.4 mmol/L (ref 3.5–5.2)
SODIUM: 142 mmol/L (ref 134–144)
Total Protein: 6.5 g/dL (ref 6.0–8.5)

## 2017-03-23 LAB — LIPID PANEL
CHOL/HDL RATIO: 3.5 ratio (ref 0.0–4.4)
Cholesterol, Total: 181 mg/dL (ref 100–199)
HDL: 51 mg/dL (ref >39)
LDL CALC: 115 mg/dL — AB (ref 0–99)
TRIGLYCERIDES: 77 mg/dL (ref 0–149)
VLDL CHOLESTEROL CAL: 15 mg/dL (ref 5–40)

## 2017-03-23 LAB — CBC
Hematocrit: 41.5 % (ref 34.0–46.6)
Hemoglobin: 13.9 g/dL (ref 11.1–15.9)
MCH: 32.3 pg (ref 26.6–33.0)
MCHC: 33.5 g/dL (ref 31.5–35.7)
MCV: 97 fL (ref 79–97)
PLATELETS: 287 10*3/uL (ref 150–379)
RBC: 4.3 x10E6/uL (ref 3.77–5.28)
RDW: 14.2 % (ref 12.3–15.4)
WBC: 4.8 10*3/uL (ref 3.4–10.8)

## 2017-03-23 LAB — CYTOLOGY - PAP: Diagnosis: NEGATIVE

## 2017-03-23 LAB — TSH: TSH: 2.16 u[IU]/mL (ref 0.450–4.500)

## 2017-03-23 NOTE — Telephone Encounter (Signed)
Patient notified of results. See lab 

## 2017-03-23 NOTE — Telephone Encounter (Signed)
[email or web address]

## 2017-03-23 NOTE — Telephone Encounter (Signed)
Patient returning call to Joy.  

## 2017-03-23 NOTE — Telephone Encounter (Signed)
-----   Message from Verner Choleborah S Leonard, CNM sent at 03/23/2017  8:04 AM EDT ----- Notify patient that Lipid profile is essentially normal, slight elevation of LDL at 115, normal<100, Continue good diet with lots of fiber, recheck next year Liver, kidney, glucose is normal TSH, CBC and Vitamin D are normal, no concerns

## 2017-10-13 ENCOUNTER — Ambulatory Visit
Admission: RE | Admit: 2017-10-13 | Discharge: 2017-10-13 | Disposition: A | Discharge status: Home / Self Care | Payer: BLUE CROSS/BLUE SHIELD | Source: Ambulatory Visit | Attending: Physician Assistant | Admitting: Physician Assistant

## 2017-10-13 ENCOUNTER — Other Ambulatory Visit: Payer: Self-pay | Admitting: Physician Assistant

## 2017-10-13 DIAGNOSIS — M25511 Pain in right shoulder: Secondary | ICD-10-CM

## 2017-10-13 DIAGNOSIS — G8929 Other chronic pain: Secondary | ICD-10-CM

## 2017-10-13 DIAGNOSIS — M5441 Lumbago with sciatica, right side: Secondary | ICD-10-CM

## 2017-10-13 DIAGNOSIS — M25512 Pain in left shoulder: Secondary | ICD-10-CM

## 2017-11-24 DIAGNOSIS — M7540 Impingement syndrome of unspecified shoulder: Secondary | ICD-10-CM | POA: Insufficient documentation

## 2018-01-11 DIAGNOSIS — G8929 Other chronic pain: Secondary | ICD-10-CM | POA: Insufficient documentation

## 2018-01-11 DIAGNOSIS — M545 Low back pain: Secondary | ICD-10-CM

## 2018-01-11 DIAGNOSIS — M25519 Pain in unspecified shoulder: Secondary | ICD-10-CM | POA: Insufficient documentation

## 2018-01-11 DIAGNOSIS — M25511 Pain in right shoulder: Secondary | ICD-10-CM

## 2018-03-27 ENCOUNTER — Ambulatory Visit (INDEPENDENT_AMBULATORY_CARE_PROVIDER_SITE_OTHER): Payer: BLUE CROSS/BLUE SHIELD | Admitting: Certified Nurse Midwife

## 2018-03-27 ENCOUNTER — Encounter: Payer: Self-pay | Admitting: Certified Nurse Midwife

## 2018-03-27 ENCOUNTER — Other Ambulatory Visit: Payer: Self-pay

## 2018-03-27 VITALS — BP 120/70 | HR 70 | Resp 16 | Ht 62.5 in | Wt 141.0 lb

## 2018-03-27 DIAGNOSIS — E559 Vitamin D deficiency, unspecified: Secondary | ICD-10-CM

## 2018-03-27 DIAGNOSIS — Z0000 Encounter for general adult medical examination without abnormal findings: Secondary | ICD-10-CM | POA: Diagnosis not present

## 2018-03-27 DIAGNOSIS — N952 Postmenopausal atrophic vaginitis: Secondary | ICD-10-CM

## 2018-03-27 DIAGNOSIS — N898 Other specified noninflammatory disorders of vagina: Secondary | ICD-10-CM

## 2018-03-27 DIAGNOSIS — Z01411 Encounter for gynecological examination (general) (routine) with abnormal findings: Secondary | ICD-10-CM | POA: Diagnosis not present

## 2018-03-27 LAB — POCT URINALYSIS DIPSTICK
BILIRUBIN UA: NEGATIVE
Blood, UA: NEGATIVE
GLUCOSE UA: NEGATIVE
Ketones, UA: NEGATIVE
Nitrite, UA: NEGATIVE
Protein, UA: NEGATIVE
Urobilinogen, UA: NEGATIVE E.U./dL — AB
pH, UA: 5 (ref 5.0–8.0)

## 2018-03-27 NOTE — Progress Notes (Signed)
62 y.o. [id number] Widowed  Caucasian Fe here for annual exam. Menopausal no HRT. [facility],and will continue with group now. Noted to have spinal changes now on pain medication which has helped sleep pattern. Denies vaginal dryness or vaginal bleeding. Has noted vaginal irritation and urinary frequency no urgency or pain. Drinks water through out day and does not feel like UTI. Denies  No health issues today.   Patient's last menstrual period was 06/22/2008 (approximate).          Sexually active: No.  The current method of family planning is post menopausal status.    Exercising: Yes.    walking Smoker:  no  Review of Systems  Constitutional:       Weight gain  Eyes: Negative.   Gastrointestinal: Negative.   Genitourinary:       Feeling of needing to empty bladder but nothing there  Musculoskeletal: Negative.   Neurological: Negative.   Endo/Heme/Allergies: Negative.   Psychiatric/Behavioral: Negative.     Health Maintenance: Pap:  03-16-15 neg HPV HR neg, 03-22-17 neg History of Abnormal Pap: yes MMG: 2019 neg per patient Self Breast exams: yes Colonoscopy:  2010 f/u 4943yrs BMD:   2018 TDaP:  2010 Shingles: not done Pneumonia: not done Hep C and HIV: both neg 2017 Labs: poct urine- wbc 1+   reports that she has never smoked. She has never used smokeless tobacco. She reports that she does not drink alcohol or use drugs.  Past Medical History:  Diagnosis Date  . 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 Benign essential tremor 2008  . Bilateral headaches   . Peripheral vertigo     Past Surgical History:  Procedure Laterality Date  . CESAREAN SECTION     x4  . COLONOSCOPY  01/30/09   normal recxheck in 10 years    No current outpatient medications on file.   No current facility-administered medications for this visit.     Family History  Problem Relation Age of Onset  . Coronary artery disease Father   . Heart  failure Father   . Stroke Paternal Grandfather   . Heart failure Maternal Grandmother     ROS:  Pertinent items are noted in HPI.  Otherwise, a comprehensive ROS was negative.  Exam:   LMP 06/22/2008 (Approximate)    Ht Readings from Last 3 Encounters:  03/22/17 5' 2.5" (1.588 m)  03/18/16 5' 2.75" (1.594 m)  03/16/15 5' 2.75" (1.594 m)    General appearance: alert, cooperative and appears stated age Head: Normocephalic, without obvious abnormality, atraumatic Neck: no adenopathy, supple, symmetrical, trachea midline and thyroid normal to inspection and palpation Lungs: clear to auscultation bilaterally CVAT bilateral negative Breasts: normal appearance, no masses or tenderness, No nipple retraction or dimpling, No nipple discharge or bleeding, No axillary or supraclavicular adenopathy Heart: regular rate and rhythm Abdomen: soft, non-tender; no masses,  no organomegaly, negative suprapubic Extremities: extremities normal, atraumatic, no cyanosis or edema Skin: Skin color, texture, turgor normal. No rashes or lesions Lymph nodes: Cervical, supraclavicular, and axillary nodes normal. No abnormal inguinal nodes palpated Neurologic: Grossly normal   Pelvic: External genitalia:  no lesions, normal female              Urethra:  normal appearing urethra with no masses, tenderness or lesions  Bladder, urethral meatus non tender, no redness              Bartholin's  and Skene's: normal                 Vagina: atrophic appearing vagina with normal color and white scant non odorous discharge, no lesions, Affirm taken              Cervix: no cervical motion tenderness, no lesions and normal appearance              Pap taken: No. Bimanual Exam:  Uterus:  normal size, contour, position, consistency, mobility, non-tender              Adnexa: normal adnexa and no mass, fullness, tenderness               Rectovaginal: Confirms               Anus:  normal sphincter tone, no lesions  Chaperone  present: yes  A:  Well Woman with normal exam  Post menopausal  Atrophic vaginitis  Back issues under follow up with [facility] now  Screening labs  P:   Reviewed health and wellness pertinent to exam  Aware of need to advise if vaginal bleeding  R/O vaginal infection, discussed atrophic vaginitis finding and dryness which was present. Discussed OTC Olive oil, coconut oil, Replens to help with dryness issues. Instructions given. Questions addressed.  Lab: affirm will treat if indicated  Continue follow up as indicated  Lab: Hgb A1-C, Lipid panel, TSH, Vitamin D  Pap smear: no   counseled on breast self exam, mammography screening, adequate intake of calcium and vitamin D, diet and exercise  return annually or prn  An After Visit Summary was printed and given to the patient.

## 2018-03-28 ENCOUNTER — Telehealth: Payer: Self-pay

## 2018-03-28 LAB — VAGINITIS/VAGINOSIS, DNA PROBE
Candida Species: NEGATIVE
GARDNERELLA VAGINALIS: NEGATIVE
TRICHOMONAS VAG: NEGATIVE

## 2018-03-28 LAB — HEMOGLOBIN A1C
Est. average glucose Bld gHb Est-mCnc: 108 mg/dL
HEMOGLOBIN A1C: 5.4 % (ref 4.8–5.6)

## 2018-03-28 LAB — LIPID PANEL
CHOLESTEROL TOTAL: 223 mg/dL — AB (ref 100–199)
Chol/HDL Ratio: 4.1 ratio (ref 0.0–4.4)
HDL: 54 mg/dL (ref >39)
LDL Calculated: 144 mg/dL — ABNORMAL HIGH (ref 0–99)
TRIGLYCERIDES: 123 mg/dL (ref 0–149)
VLDL CHOLESTEROL CAL: 25 mg/dL (ref 5–40)

## 2018-03-28 LAB — TSH: TSH: 3.05 u[IU]/mL (ref 0.450–4.500)

## 2018-03-28 LAB — VITAMIN D 25 HYDROXY (VIT D DEFICIENCY, FRACTURES): Vit D, 25-Hydroxy: 40.3 ng/mL (ref 30.0–100.0)

## 2018-03-28 NOTE — Telephone Encounter (Signed)
-----   Message from Verner Choleborah S Leonard, CNM sent at 03/28/2018  1:43 PM EDT ----- Notify patient her vaginal screen was negative for yeast, BV and trichomonas Vitamin D is normal at 40.3 TSH is normal Lipid panel borderline elevation at 223 of Cholesterol, LDL 144 previous 115, HDL is normal at 54. Patient needs to work on cholesterol with increase in colorful fruit/ vegetables, with lean protein, watch cholesterol intake Repeat in 6 months please schedule Hgb A1-C is normal

## 2018-03-28 NOTE — Telephone Encounter (Signed)
[email or web address]

## 2018-03-29 ENCOUNTER — Other Ambulatory Visit: Payer: Self-pay | Admitting: Certified Nurse Midwife

## 2018-03-29 DIAGNOSIS — R6889 Other general symptoms and signs: Secondary | ICD-10-CM

## 2018-03-29 NOTE — Telephone Encounter (Signed)
Patient notified of results. See lab 

## 2018-04-03 ENCOUNTER — Encounter: Payer: Self-pay | Admitting: Certified Nurse Midwife

## 2018-07-23 DIAGNOSIS — M12811 Other specific arthropathies, not elsewhere classified, right shoulder: Secondary | ICD-10-CM | POA: Insufficient documentation

## 2018-08-16 NOTE — Progress Notes (Signed)
NEED  ORDERS OR UPCOMING SURGERY

## 2018-08-27 NOTE — Patient Instructions (Signed)
Chaniah Tokash  08/27/2018   Your procedure is scheduled on: 09-06-18    Report to [facility] Main  Entrance     Report to admitting at 8:00AM    Call this number if you have problems the morning of surgery [phone]      Remember: Do not eat food or drink liquids :After Midnight. BRUSH YOUR TEETH MORNING OF SURGERY AND RINSE YOUR MOUTH OUT, NO CHEWING GUM CANDY OR MINTS.     Take these medicines the morning of surgery with A SIP OF WATER: NONE                                You may not have any metal on your body including hair pins and              piercings  Do not wear jewelry, make-up, lotions, powders or perfumes, deodorant             Do not wear nail polish.  Do not shave  48 hours prior to surgery.               Do not bring valuables to the hospital. [place] IS NOT             RESPONSIBLE   FOR VALUABLES.  Contacts, dentures or bridgework may not be worn into surgery.  Leave suitcase in the car. After surgery it may be brought to your room.     Patients discharged the day of surgery will not be allowed to drive home. IF YOU ARE HAVING SURGERY AND GOING HOME THE SAME DAY, YOU MUST HAVE AN ADULT TO DRIVE YOU HOME AND BE WITH YOU FOR 24 HOURS. YOU MAY GO HOME BY TAXI OR UBER OR ORTHERWISE, BUT AN ADULT MUST ACCOMPANY YOU HOME AND STAY WITH YOU FOR 24 HOURS.  Name and phone number of your driver:  Special Instructions: N/A              Please read over the following fact sheets you were given: _____________________________________________________________________             [facility] - Preparing for Surgery Before surgery, you can play an important role.  Because skin is not sterile, your skin needs to be as free of germs as possible.  You can reduce the number of germs on your skin by washing with CHG (chlorahexidine gluconate) soap before surgery.  CHG is an antiseptic cleaner which kills germs and bonds with the skin to continue  killing germs even after washing. Please DO NOT use if you have an allergy to CHG or antibacterial soaps.  If your skin becomes reddened/irritated stop using the CHG and inform your nurse when you arrive at Short Stay. Do not shave (including legs and underarms) for at least 48 hours prior to the first CHG shower.  You may shave your face/neck. Please follow these instructions carefully:  1.  Shower with CHG Soap the night before surgery and the  morning of Surgery.  2.  If you choose to wash your hair, wash your hair first as usual with your  normal  shampoo.  3.  After you shampoo, rinse your hair and body thoroughly to remove the  shampoo.  4.  Use CHG as you would any other liquid soap.  You can apply chg directly  to the skin and wash                       Gently with a scrungie or clean washcloth.  5.  Apply the CHG Soap to your body ONLY FROM THE NECK DOWN.   Do not use on face/ open                           Wound or open sores. Avoid contact with eyes, ears mouth and genitals (private parts).                       Wash face,  Genitals (private parts) with your normal soap.             6.  Wash thoroughly, paying special attention to the area where your surgery  will be performed.  7.  Thoroughly rinse your body with warm water from the neck down.  8.  DO NOT shower/wash with your normal soap after using and rinsing off  the CHG Soap.                9.  Pat yourself dry with a clean towel.            10.  Wear clean pajamas.            11.  Place clean sheets on your bed the night of your first shower and do not  sleep with pets. Day of Surgery : Do not apply any lotions/deodorants the morning of surgery.  Please wear clean clothes to the hospital/surgery center.  FAILURE TO FOLLOW THESE INSTRUCTIONS MAY RESULT IN THE CANCELLATION OF YOUR SURGERY PATIENT SIGNATURE_________________________________  NURSE  SIGNATURE__________________________________  ________________________________________________________________________

## 2018-08-29 ENCOUNTER — Inpatient Hospital Stay (HOSPITAL_COMMUNITY)
Admission: RE | Admit: 2018-08-29 | Discharge: 2018-08-29 | Disposition: A | Discharge status: Home / Self Care | Payer: BLUE CROSS/BLUE SHIELD | Source: Ambulatory Visit

## 2018-09-03 ENCOUNTER — Encounter (HOSPITAL_COMMUNITY): Payer: Self-pay

## 2018-09-03 ENCOUNTER — Encounter (HOSPITAL_COMMUNITY)
Admission: RE | Admit: 2018-09-03 | Discharge: 2018-09-03 | Disposition: A | Discharge status: Home / Self Care | Payer: BLUE CROSS/BLUE SHIELD | Source: Ambulatory Visit | Attending: Orthopedic Surgery | Admitting: Orthopedic Surgery

## 2018-09-03 ENCOUNTER — Other Ambulatory Visit: Payer: Self-pay

## 2018-09-03 DIAGNOSIS — Z01812 Encounter for preprocedural laboratory examination: Secondary | ICD-10-CM | POA: Insufficient documentation

## 2018-09-03 HISTORY — DX: Gastro-esophageal reflux disease without esophagitis: K21.9

## 2018-09-03 LAB — BASIC METABOLIC PANEL
Anion gap: 7 (ref 5–15)
BUN: 18 mg/dL (ref 8–23)
CHLORIDE: 108 mmol/L (ref 98–111)
CO2: 25 mmol/L (ref 22–32)
Calcium: 9.3 mg/dL (ref 8.9–10.3)
Creatinine, Ser: 0.75 mg/dL (ref 0.44–1.00)
GFR calc Af Amer: >60 mL/min (ref >60)
GLUCOSE: 109 mg/dL — AB (ref 70–99)
POTASSIUM: 4 mmol/L (ref 3.5–5.1)
SODIUM: 140 mmol/L (ref 135–145)

## 2018-09-03 LAB — CBC
HEMATOCRIT: 43.4 % (ref 36.0–46.0)
Hemoglobin: 14.4 g/dL (ref 12.0–15.0)
MCH: 31.2 pg (ref 26.0–34.0)
MCHC: 33.2 g/dL (ref 30.0–36.0)
MCV: 94.1 fL (ref 80.0–100.0)
NRBC: 0 % (ref 0.0–0.2)
PLATELETS: 335 10*3/uL (ref 150–400)
RBC: 4.61 MIL/uL (ref 3.87–5.11)
RDW: 12.9 % (ref 11.5–15.5)
WBC: 5.2 10*3/uL (ref 4.0–10.5)

## 2018-09-03 LAB — SURGICAL PCR SCREEN
MRSA, PCR: NEGATIVE
Staphylococcus aureus: NEGATIVE

## 2018-09-03 NOTE — Progress Notes (Signed)
Please place orders in Epic as patient had pre-op appointment on 09/03/2018! Thank you!

## 2018-09-03 NOTE — Progress Notes (Signed)
Spoke with Jodell Cipro, PA for Perioperative services and per Anesthesia , they do not want patient to take Belbuca three days before surgery. Patient instructed to not use Belbuca on Monday night, Tuesday night and Wednesday night. Patient verbalized understanding.

## 2018-09-03 NOTE — Patient Instructions (Addendum)
Kristi Gamble  09/03/2018   Your procedure is scheduled on: Thursday 09/06/2018  Report to [facility] Main  Entrance              Report to admitting at  0800  AM    Call this number if you have problems the morning of surgery [phone]    Remember: Do not eat food or drink liquids :After Midnight.               BRUSH YOUR TEETH MORNING OF SURGERY AND RINSE YOUR MOUTH OUT, NO CHEWING GUM CANDY OR MINTS.     Take these medicines the morning of surgery with A SIP OF WATER: none              Please do not take Belbuca three days before surgery per Jodell Cipro, PA for Anesthesia!              (Do not take Belbuca on Monday night, Tuesday night, and  Wednesday night!)                               You may not have any metal on your body including hair pins and              piercings  Do not wear jewelry, make-up, lotions, powders or perfumes, deodorant             Do not wear nail polish.  Do not shave  48 hours prior to surgery.               Do not bring valuables to the hospital. [facility] IS NOT             RESPONSIBLE   FOR VALUABLES.  Contacts, dentures or bridgework may not be worn into surgery.  Leave suitcase in the car. After surgery it may be brought to your room.                  Please read over the following fact sheets you were given: _____________________________________________________________________             [facility] - Preparing for Surgery Before surgery, you can play an important role.  Because skin is not sterile, your skin needs to be as free of germs as possible.  You can reduce the number of germs on your skin by washing with CHG (chlorahexidine gluconate) soap before surgery.  CHG is an antiseptic cleaner which kills germs and bonds with the skin to continue killing germs even after washing. Please DO NOT use if you have an allergy to CHG or antibacterial soaps.  If your skin becomes reddened/irritated stop using  the CHG and inform your nurse when you arrive at Short Stay. Do not shave (including legs and underarms) for at least 48 hours prior to the first CHG shower.  You may shave your face/neck. Please follow these instructions carefully:  1.  Shower with CHG Soap the night before surgery and the  morning of Surgery.  2.  If you choose to wash your hair, wash your hair first as usual with your  normal  shampoo.  3.  After you shampoo, rinse your hair and body thoroughly to remove the  shampoo.  4.  Use CHG as you would any other liquid soap.  You can apply chg directly  to the skin and wash                       Gently with a scrungie or clean washcloth.  5.  Apply the CHG Soap to your body ONLY FROM THE NECK DOWN.   Do not use on face/ open                           Wound or open sores. Avoid contact with eyes, ears mouth and genitals (private parts).                       Wash face,  Genitals (private parts) with your normal soap.             6.  Wash thoroughly, paying special attention to the area where your surgery  will be performed.  7.  Thoroughly rinse your body with warm water from the neck down.  8.  DO NOT shower/wash with your normal soap after using and rinsing off  the CHG Soap.                9.  Pat yourself dry with a clean towel.            10.  Wear clean pajamas.            11.  Place clean sheets on your bed the night of your first shower and do not  sleep with pets. Day of Surgery : Do not apply any lotions/deodorants the morning of surgery.  Please wear clean clothes to the hospital/surgery center.  FAILURE TO FOLLOW THESE INSTRUCTIONS MAY RESULT IN THE CANCELLATION OF YOUR SURGERY PATIENT SIGNATURE_________________________________  NURSE SIGNATURE__________________________________  ________________________________________________________________________

## 2018-09-06 ENCOUNTER — Ambulatory Visit (HOSPITAL_COMMUNITY): Payer: BLUE CROSS/BLUE SHIELD | Admitting: Certified Registered"

## 2018-09-06 ENCOUNTER — Encounter (HOSPITAL_COMMUNITY)
Admission: RE | Disposition: A | Discharge status: Home / Self Care | Payer: Self-pay | Source: Home / Self Care | Attending: Orthopedic Surgery

## 2018-09-06 ENCOUNTER — Encounter (HOSPITAL_COMMUNITY): Payer: Self-pay | Admitting: Emergency Medicine

## 2018-09-06 ENCOUNTER — Ambulatory Visit (HOSPITAL_COMMUNITY): Payer: BLUE CROSS/BLUE SHIELD | Admitting: Physician Assistant

## 2018-09-06 ENCOUNTER — Ambulatory Visit (HOSPITAL_COMMUNITY)
Admission: RE | Admit: 2018-09-06 | Discharge: 2018-09-06 | Disposition: A | Discharge status: Home / Self Care | Payer: BLUE CROSS/BLUE SHIELD | Attending: Orthopedic Surgery | Admitting: Orthopedic Surgery

## 2018-09-06 DIAGNOSIS — R42 Dizziness and giddiness: Secondary | ICD-10-CM | POA: Insufficient documentation

## 2018-09-06 DIAGNOSIS — R51 Headache: Secondary | ICD-10-CM | POA: Diagnosis not present

## 2018-09-06 DIAGNOSIS — Z8249 Family history of ischemic heart disease and other diseases of the circulatory system: Secondary | ICD-10-CM | POA: Insufficient documentation

## 2018-09-06 DIAGNOSIS — M19011 Primary osteoarthritis, right shoulder: Secondary | ICD-10-CM

## 2018-09-06 DIAGNOSIS — Z823 Family history of stroke: Secondary | ICD-10-CM | POA: Insufficient documentation

## 2018-09-06 DIAGNOSIS — Z79899 Other long term (current) drug therapy: Secondary | ICD-10-CM | POA: Diagnosis not present

## 2018-09-06 DIAGNOSIS — M75101 Unspecified rotator cuff tear or rupture of right shoulder, not specified as traumatic: Secondary | ICD-10-CM | POA: Diagnosis not present

## 2018-09-06 DIAGNOSIS — G250 Essential tremor: Secondary | ICD-10-CM | POA: Diagnosis not present

## 2018-09-06 DIAGNOSIS — M1990 Unspecified osteoarthritis, unspecified site: Secondary | ICD-10-CM | POA: Diagnosis not present

## 2018-09-06 DIAGNOSIS — K219 Gastro-esophageal reflux disease without esophagitis: Secondary | ICD-10-CM | POA: Diagnosis not present

## 2018-09-06 HISTORY — PX: REVERSE SHOULDER ARTHROPLASTY: SHX5054

## 2018-09-06 SURGERY — ARTHROPLASTY, SHOULDER, TOTAL, REVERSE
Anesthesia: Regional | Site: Shoulder | Laterality: Right

## 2018-09-06 MED ORDER — FENTANYL CITRATE (PF) 100 MCG/2ML IJ SOLN: 25.0000 ug | INTRAMUSCULAR | Status: DC | PRN

## 2018-09-06 MED ORDER — LIDOCAINE 2% (20 MG/ML) 5 ML SYRINGE
INTRAMUSCULAR | Status: AC
  Filled 2018-09-06: qty 5

## 2018-09-06 MED ORDER — TRANEXAMIC ACID-NACL 1000-0.7 MG/100ML-% IV SOLN
1000.0000 mg | INTRAVENOUS | Status: AC
  Administered 2018-09-06: 1000 mg via INTRAVENOUS
  Filled 2018-09-06: qty 100

## 2018-09-06 MED ORDER — OXYCODONE HCL 5 MG/5ML PO SOLN
5.0000 mg | Freq: Once | ORAL | Status: DC | PRN
  Filled 2018-09-06: qty 5

## 2018-09-06 MED ORDER — PHENYLEPHRINE 40 MCG/ML (10ML) SYRINGE FOR IV PUSH (FOR BLOOD PRESSURE SUPPORT)
PREFILLED_SYRINGE | INTRAVENOUS | Status: AC
  Filled 2018-09-06: qty 10

## 2018-09-06 MED ORDER — OXYCODONE-ACETAMINOPHEN 5-325 MG PO TABS: 1.0000 | ORAL_TABLET | ORAL | 0 refills | Status: AC | PRN

## 2018-09-06 MED ORDER — LIDOCAINE 2% (20 MG/ML) 5 ML SYRINGE
INTRAMUSCULAR | Status: DC | PRN
  Administered 2018-09-06: 60 mg via INTRAVENOUS

## 2018-09-06 MED ORDER — PROPOFOL 10 MG/ML IV BOLUS
INTRAVENOUS | Status: AC
  Filled 2018-09-06: qty 20

## 2018-09-06 MED ORDER — ONDANSETRON HCL 4 MG/2ML IJ SOLN
INTRAMUSCULAR | Status: AC
  Filled 2018-09-06: qty 2

## 2018-09-06 MED ORDER — SUGAMMADEX SODIUM 200 MG/2ML IV SOLN
INTRAVENOUS | Status: DC | PRN
  Administered 2018-09-06: 200 mg via INTRAVENOUS

## 2018-09-06 MED ORDER — LACTATED RINGERS IV SOLN
INTRAVENOUS | Status: DC
Start: 2018-09-06 — End: 2018-09-06
  Administered 2018-09-06: 08:00:00 via INTRAVENOUS

## 2018-09-06 MED ORDER — EPHEDRINE SULFATE-NACL 50-0.9 MG/10ML-% IV SOSY
PREFILLED_SYRINGE | INTRAVENOUS | Status: DC | PRN
  Administered 2018-09-06: 10 mg via INTRAVENOUS

## 2018-09-06 MED ORDER — EPHEDRINE 5 MG/ML INJ
INTRAVENOUS | Status: AC
  Filled 2018-09-06: qty 10

## 2018-09-06 MED ORDER — PHENYLEPHRINE HCL-NACL 10-0.9 MG/250ML-% IV SOLN
INTRAVENOUS | Status: AC
  Filled 2018-09-06: qty 250

## 2018-09-06 MED ORDER — PROPOFOL 10 MG/ML IV BOLUS
INTRAVENOUS | Status: DC | PRN
  Administered 2018-09-06: 130 mg via INTRAVENOUS

## 2018-09-06 MED ORDER — ROCURONIUM BROMIDE 10 MG/ML (PF) SYRINGE
PREFILLED_SYRINGE | INTRAVENOUS | Status: DC | PRN
  Administered 2018-09-06: 50 mg via INTRAVENOUS

## 2018-09-06 MED ORDER — 0.9 % SODIUM CHLORIDE (POUR BTL) OPTIME
TOPICAL | Status: DC | PRN
  Administered 2018-09-06: 1000 mL

## 2018-09-06 MED ORDER — CEFAZOLIN SODIUM-DEXTROSE 2-4 GM/100ML-% IV SOLN
2.0000 g | INTRAVENOUS | Status: AC
  Administered 2018-09-06: 2 g via INTRAVENOUS
  Filled 2018-09-06: qty 100

## 2018-09-06 MED ORDER — BUPIVACAINE LIPOSOME 1.3 % IJ SUSP
INTRAMUSCULAR | Status: DC | PRN
  Administered 2018-09-06: 133 mg via PERINEURAL

## 2018-09-06 MED ORDER — ACETAMINOPHEN 160 MG/5ML PO SOLN: 1000.0000 mg | Freq: Once | ORAL | Status: DC | PRN

## 2018-09-06 MED ORDER — ACETAMINOPHEN 10 MG/ML IV SOLN: 1000.0000 mg | Freq: Once | INTRAVENOUS | Status: DC | PRN

## 2018-09-06 MED ORDER — ACETAMINOPHEN 500 MG PO TABS
ORAL_TABLET | ORAL | Status: AC
  Administered 2018-09-06: 1000 mg via ORAL
  Filled 2018-09-06: qty 2

## 2018-09-06 MED ORDER — ONDANSETRON HCL 4 MG/2ML IJ SOLN
INTRAMUSCULAR | Status: DC | PRN
  Administered 2018-09-06: 4 mg via INTRAVENOUS

## 2018-09-06 MED ORDER — DEXAMETHASONE SODIUM PHOSPHATE 10 MG/ML IJ SOLN
INTRAMUSCULAR | Status: DC | PRN
  Administered 2018-09-06: 4 mg via INTRAVENOUS

## 2018-09-06 MED ORDER — SODIUM CHLORIDE 0.9 % IV SOLN
INTRAVENOUS | Status: DC | PRN
  Administered 2018-09-06: 25 ug/min via INTRAVENOUS

## 2018-09-06 MED ORDER — DEXAMETHASONE SODIUM PHOSPHATE 10 MG/ML IJ SOLN
INTRAMUSCULAR | Status: AC
  Filled 2018-09-06: qty 1

## 2018-09-06 MED ORDER — STERILE WATER FOR IRRIGATION IR SOLN
Status: DC | PRN
  Administered 2018-09-06: 2000 mL

## 2018-09-06 MED ORDER — OXYCODONE HCL 5 MG PO TABS: 5.0000 mg | ORAL_TABLET | Freq: Once | ORAL | Status: DC | PRN

## 2018-09-06 MED ORDER — ACETAMINOPHEN 500 MG PO TABS
1000.0000 mg | ORAL_TABLET | Freq: Once | ORAL | Status: AC
  Administered 2018-09-06: 1000 mg via ORAL

## 2018-09-06 MED ORDER — FENTANYL CITRATE (PF) 100 MCG/2ML IJ SOLN
50.0000 ug | INTRAMUSCULAR | Status: AC
  Administered 2018-09-06: 50 ug via INTRAVENOUS
  Filled 2018-09-06: qty 2

## 2018-09-06 MED ORDER — FENTANYL CITRATE (PF) 100 MCG/2ML IJ SOLN
INTRAMUSCULAR | Status: AC
  Filled 2018-09-06: qty 2

## 2018-09-06 MED ORDER — CHLORHEXIDINE GLUCONATE 4 % EX LIQD: 60.0000 mL | Freq: Once | CUTANEOUS | Status: DC

## 2018-09-06 MED ORDER — FENTANYL CITRATE (PF) 250 MCG/5ML IJ SOLN
INTRAMUSCULAR | Status: DC | PRN
  Administered 2018-09-06 (×2): 50 ug via INTRAVENOUS

## 2018-09-06 MED ORDER — ACETAMINOPHEN 500 MG PO TABS: 1000.0000 mg | ORAL_TABLET | Freq: Once | ORAL | Status: DC | PRN

## 2018-09-06 MED ORDER — NAPROXEN 500 MG PO TABS: 500.0000 mg | ORAL_TABLET | Freq: Two times a day (BID) | ORAL | 1 refills | Status: DC

## 2018-09-06 MED ORDER — PROMETHAZINE HCL 25 MG/ML IJ SOLN
5.0000 mg | Freq: Once | INTRAMUSCULAR | Status: AC
  Administered 2018-09-06: 5 mg via INTRAVENOUS

## 2018-09-06 MED ORDER — PHENYLEPHRINE 40 MCG/ML (10ML) SYRINGE FOR IV PUSH (FOR BLOOD PRESSURE SUPPORT)
PREFILLED_SYRINGE | INTRAVENOUS | Status: DC | PRN
  Administered 2018-09-06: 120 ug via INTRAVENOUS

## 2018-09-06 MED ORDER — CYCLOBENZAPRINE HCL 10 MG PO TABS: 10.0000 mg | ORAL_TABLET | Freq: Three times a day (TID) | ORAL | 1 refills | Status: DC | PRN

## 2018-09-06 MED ORDER — ONDANSETRON HCL 4 MG PO TABS: 4.0000 mg | ORAL_TABLET | Freq: Three times a day (TID) | ORAL | 0 refills | Status: DC | PRN

## 2018-09-06 MED ORDER — BUPIVACAINE HCL (PF) 0.5 % IJ SOLN
INTRAMUSCULAR | Status: DC | PRN
  Administered 2018-09-06: 15 mL via PERINEURAL

## 2018-09-06 MED ORDER — PROMETHAZINE HCL 25 MG/ML IJ SOLN
INTRAMUSCULAR | Status: AC
  Administered 2018-09-06: 5 mg via INTRAVENOUS
  Filled 2018-09-06: qty 1

## 2018-09-06 MED ORDER — MIDAZOLAM HCL 2 MG/2ML IJ SOLN
1.0000 mg | INTRAMUSCULAR | Status: AC
  Administered 2018-09-06: 2 mg via INTRAVENOUS
  Filled 2018-09-06: qty 2

## 2018-09-06 MED ORDER — ROCURONIUM BROMIDE 10 MG/ML (PF) SYRINGE
PREFILLED_SYRINGE | INTRAVENOUS | Status: AC
  Filled 2018-09-06: qty 10

## 2018-09-06 MED ORDER — SUGAMMADEX SODIUM 200 MG/2ML IV SOLN
INTRAVENOUS | Status: AC
  Filled 2018-09-06: qty 2

## 2018-09-06 SURGICAL SUPPLY — 57 items
BAG ZIPLOCK 12X15 (MISCELLANEOUS) ×3 IMPLANT
BLADE SAW SGTL 83.5X18.5 (BLADE) ×3 IMPLANT
COVER SURGICAL LIGHT HANDLE (MISCELLANEOUS) ×3 IMPLANT
COVER WAND RF STERILE (DRAPES) IMPLANT
DERMABOND ADVANCED (GAUZE/BANDAGES/DRESSINGS) ×2
DERMABOND ADVANCED .7 DNX12 (GAUZE/BANDAGES/DRESSINGS) ×1 IMPLANT
DRAPE INCISE IOBAN 66X45 STRL (DRAPES) IMPLANT
DRAPE ORTHO SPLIT 77X108 STRL (DRAPES) ×2
DRAPE POUCH INSTRU U-SHP 10X18 (DRAPES) ×3 IMPLANT
DRAPE SURG 17X11 SM STRL (DRAPES) ×3 IMPLANT
DRAPE SURG ORHT 6 SPLT 77X108 (DRAPES) ×1 IMPLANT
DRAPE U-SHAPE 47X51 STRL (DRAPES) ×3 IMPLANT
DRSG AQUACEL AG ADV 3.5X10 (GAUZE/BANDAGES/DRESSINGS) ×3 IMPLANT
DURAPREP 26ML APPLICATOR (WOUND CARE) ×3 IMPLANT
ELECT BLADE TIP CTD 4 INCH (ELECTRODE) ×3 IMPLANT
ELECT REM PT RETURN 15FT ADLT (MISCELLANEOUS) ×3 IMPLANT
FACESHIELD WRAPAROUND (MASK) ×12 IMPLANT
FACESHIELD WRAPAROUND OR TEAM (MASK) ×3 IMPLANT
GLENOID UNI REV MOD 24 +2 LAT (Joint) ×2 IMPLANT
GLENOSPHERE 36 +4 LAT/24 (Joint) ×2 IMPLANT
GLOVE BIO SURGEON STRL SZ7.5 (GLOVE) ×3 IMPLANT
GLOVE BIO SURGEON STRL SZ8 (GLOVE) ×3 IMPLANT
GLOVE SS BIOGEL STRL SZ 7 (GLOVE) ×1 IMPLANT
GLOVE SS BIOGEL STRL SZ 7.5 (GLOVE) ×1 IMPLANT
GLOVE SUPERSENSE BIOGEL SZ 7 (GLOVE) ×2
GLOVE SUPERSENSE BIOGEL SZ 7.5 (GLOVE) ×2
GOWN STRL REUS W/ TWL LRG LVL3 (GOWN DISPOSABLE) ×1 IMPLANT
GOWN STRL REUS W/TWL LRG LVL3 (GOWN DISPOSABLE) ×2
KIT BASIN OR (CUSTOM PROCEDURE TRAY) ×3 IMPLANT
KIT SET UNIVERSAL (KITS) ×2 IMPLANT
KIT TURNOVER KIT A (KITS) IMPLANT
LINER HUMERAL 36 +3MM SM (Shoulder) ×2 IMPLANT
MANIFOLD NEPTUNE II (INSTRUMENTS) ×3 IMPLANT
NDL TAPERED W/ NITINOL LOOP (MISCELLANEOUS) ×1 IMPLANT
NEEDLE TAPERED W/ NITINOL LOOP (MISCELLANEOUS) IMPLANT
NS IRRIG 1000ML POUR BTL (IV SOLUTION) ×6 IMPLANT
PACK SHOULDER (CUSTOM PROCEDURE TRAY) ×3 IMPLANT
PAD ARMBOARD 7.5X6 YLW CONV (MISCELLANEOUS) IMPLANT
PROTECTOR NERVE ULNAR (MISCELLANEOUS) ×3 IMPLANT
SCREW CENTRAL MOD 30MM (Screw) ×2 IMPLANT
SCREW PERI LOCK 5.5X16 (Screw) ×4 IMPLANT
SCREW PERI LOCK 5.5X24 (Screw) ×2 IMPLANT
SCREW PERI LOCK 5.5X36 (Screw) ×2 IMPLANT
SLING ARM FOAM STRAP LRG (SOFTGOODS) ×1 IMPLANT
SLING ARM FOAM STRAP MED (SOFTGOODS) ×3 IMPLANT
SPONGE LAP 4X18 RFD (DISPOSABLE) ×3 IMPLANT
STEM REVERSE SIZE 5-135 (Shoulder) ×2 IMPLANT
SUCTION FRAZIER HANDLE 12FR (TUBING) ×2
SUCTION TUBE FRAZIER 12FR DISP (TUBING) ×1 IMPLANT
SUT MNCRL AB 3-0 PS2 18 (SUTURE) ×3 IMPLANT
SUT VIC AB 1 CT1 36 (SUTURE) ×3 IMPLANT
SUTURE TAPE 1.3 40 TPR END (SUTURE) ×1 IMPLANT
SUTURETAPE 1.3 40 TPR END (SUTURE) ×6
TAPE PAPER 3X10 WHT MICROPORE (GAUZE/BANDAGES/DRESSINGS) IMPLANT
TOWEL OR 17X26 10 PK STRL BLUE (TOWEL DISPOSABLE) ×6 IMPLANT
TOWEL OR NON WOVEN STRL DISP B (DISPOSABLE) ×3 IMPLANT
WATER STERILE IRR 1000ML POUR (IV SOLUTION) ×9 IMPLANT

## 2018-09-06 NOTE — Anesthesia Preprocedure Evaluation (Signed)
Anesthesia Evaluation  Patient identified by MRN, date of birth, ID band Patient awake    Reviewed: Allergy & Precautions, NPO status , Patient's Chart, lab work & pertinent test results  History of Anesthesia Complications Negative for: history of anesthetic complications  Airway Mallampati: II  TM Distance: >3 FB Neck ROM: Full    Dental  (+) Teeth Intact   Pulmonary neg pulmonary ROS,    breath sounds clear to auscultation       Cardiovascular negative cardio ROS   Rhythm:Regular     Neuro/Psych  Headaches, negative psych ROS   GI/Hepatic Neg liver ROS, GERD  Controlled,  Endo/Other  negative endocrine ROS  Renal/GU negative Renal ROS     Musculoskeletal  (+) Arthritis ,   Abdominal   Peds  Hematology negative hematology ROS (+)   Anesthesia Other Findings   Reproductive/Obstetrics                             Anesthesia Physical Anesthesia Plan  ASA: II  Anesthesia Plan: General and Regional   Post-op Pain Management:  Regional for Post-op pain   Induction: Intravenous  PONV Risk Score and Plan: 3 and Ondansetron and Dexamethasone  Airway Management Planned: Oral ETT  Additional Equipment: None  Intra-op Plan:   Post-operative Plan: Extubation in OR  Informed Consent: I have reviewed the patients History and Physical, chart, labs and discussed the procedure including the risks, benefits and alternatives for the proposed anesthesia with the patient or authorized representative who has indicated his/her understanding and acceptance.     Dental advisory given  Plan Discussed with: CRNA and Surgeon  Anesthesia Plan Comments:         Anesthesia Quick Evaluation

## 2018-09-06 NOTE — Progress Notes (Signed)
AssistedDr. Moser with right, ultrasound guided, interscalene  block. Side rails up, monitors on throughout procedure. See vital signs in flow sheet. Tolerated Procedure well.  

## 2018-09-06 NOTE — Anesthesia Procedure Notes (Signed)
Procedure Name: Intubation Date/Time: 09/06/2018 10:47 AM Performed by: Niel Hummer, CRNA Pre-anesthesia Checklist: Patient being monitored, Suction available, Emergency Drugs available and Patient identified Patient Re-evaluated:Patient Re-evaluated prior to induction Oxygen Delivery Method: Circle system utilized Preoxygenation: Pre-oxygenation with 100% oxygen Induction Type: IV induction Ventilation: Mask ventilation without difficulty Laryngoscope Size: Mac and 4 Grade View: Grade I Tube type: Oral Tube size: 7.0 mm Number of attempts: 1 Airway Equipment and Method: Stylet Placement Confirmation: ETT inserted through vocal cords under direct vision,  positive ETCO2 and breath sounds checked- equal and bilateral Secured at: 21 cm Tube secured with: Tape Dental Injury: Teeth and Oropharynx as per pre-operative assessment

## 2018-09-06 NOTE — Transfer of Care (Signed)
Immediate Anesthesia Transfer of Care Note  Patient: Kristi Gamble  Procedure(s) Performed: REVERSE SHOULDER ARTHROPLASTY (Right Shoulder)  Patient Location: PACU  Anesthesia Type:General  Level of Consciousness: awake, alert  and oriented  Airway & Oxygen Therapy: Patient Spontanous Breathing and Patient connected to face mask oxygen  Post-op Assessment: Report given to RN and Post -op Vital signs reviewed and stable  Post vital signs: Reviewed and stable  Last Vitals:  Vitals Value Taken Time  BP 148/74 09/06/2018 12:09 PM  Temp    Pulse 67 09/06/2018 12:11 PM  Resp 19 09/06/2018 12:11 PM  SpO2 99 % 09/06/2018 12:11 PM  Vitals shown include unvalidated device data.  Last Pain:  Vitals:   09/06/18 1006  TempSrc:   PainSc: 0-No pain      Patients Stated Pain Goal: 4 (09/06/18 8115)  Complications: No apparent anesthesia complications

## 2018-09-06 NOTE — Op Note (Signed)
09/06/2018  11:52 AM  PATIENT:   Kristi Gamble  63 y.o. female  PRE-OPERATIVE DIAGNOSIS:  right shoulder rotator cuff tear arthropathy  POST-OPERATIVE DIAGNOSIS: Same  PROCEDURE: Right shoulder reverse arthroplasty using a size 5 press-fit Arthrex stem, +5 polyethylene insert, 36/+4 glenosphere, small/+2 baseplate  SURGEON:  Aniah Pauli, Vania ReaKevin M. M.D.  ASSISTANTS: Ralene Batheracy Shuford, PA-C  ANESTHESIA:   General endotracheal with interscalene block using Exparel  EBL: 150 cc  SPECIMEN: None  Drains: None   PATIENT DISPOSITION:  PACU - hemodynamically stable.    PLAN OF CARE: Discharge to home after PACU  Brief history:  Kristi Gamble has been followed for chronic and progressively increasing right shoulder pain related to end-stage rotator cuff tear arthropathy.  Due to her increasing pain and functional limitation she is brought to the operating room at this time for planned right shoulder reverse arthroplasty.  Preoperatively she had been counseled regarding treatment options as well as the potential risks versus benefits thereof.  Possible surgical complications were reviewed including bleeding, infection, neurovascular injury, persistent pain, loss of motion, anesthetic complication, failure of the implant, and possible need for additional surgery.  She understands and accepts and agrees with her planned procedure.  Procedure detail:  After undergoing routine preop evaluation patient received prophylactic antibiotics and interscalene block with Exparel was established in the holding area by the anesthesia department.  Placed supine on the operating table underwent smooth duction of a general endotracheal anesthesia.  Placed into the beachchair position and appropriately padded and protected.  The right shoulder girdle region was sterilely prepped and draped in standard fashion.  Timeout was called.  An anterior deltopectoral approach was made to the right shoulder through an 8 cm  incision.  Skin flaps are elevated dissection carried deeply and electrocautery was used for hemostasis.  The deltopectoral interval was then developed from proximal to distal with a vein taken laterally in the upper centimeter of the pectoralis major tendon was tenotomized for exposure.  The conjoined tendon was mobilized and retracted medially.  The long head biceps tendon was then tenodesed at the upper border of the pectoralis major tendon and then proximally it was excised.  I then performed a subscapularis "peel" removing the insertion from the lesser tuberosity and tagged and the free margin with a pair of suture tape sutures.  We then divided the capsular attachments from the anterior and inferior margins of the humeral neck and humeral head was delivered through the wound.  We outlined the proposed humeral head resection with the extra medullary guide and this was then performed with an oscillating saw maintain the native retroversion of approximate 30 degrees.  A metal cap was then placed over the cut proximal humeral surface and we then exposed the glenoid with appropriate retractors.  I performed a circumferential labral resection gaining complete visualization the periphery of the glenoid.  A guidepin was then's placed into the center of the glenoid with an approximately 10 degree inferior tilt and then we reamed the glenoid with first our central and then peripheral reamer drilled the central peg hole and then tapped after confirming that a 30 mm length was appropriate.  Our final glenoid baseplate was then assembled on the back table and then inserted with excellent fit fixation.  The peripheral locking screws were all then placed after being drilled and measured appropriately and overall excellent stability was achieved.  We then placed a 36+4 onto the small baseplate this was impacted and the locking screw was  then placed.  This point we then returned our attention to the proximal humerus where the  canal was reamed and then we broached and found the canal to be a very tight fit and were only able to see a size 5 to the appropriate level.  We then used the metaphyseal reamer and once this was completed we went ahead and inserted the size 5 monoblock stem with excellent fit fixation.  Trial reduction was then completed this showed good soft tissue balance.  A a final +3 polyethylene insert was then placed into the metaphysis and a final reduction was then performed and this showed excellent soft tissue balance and good shoulder motion and stability.  The wound was then copiously irrigated.  The subscapularis was then repaired back to the metaphysis with the suture tape sutures.  Final inspection and irrigation was then completed.  Hemostasis was obtained.  The deltopectoral interval was closed with a series of figure-of-eight #1 Vicryl sutures.  2-0 Vicryl used for subcu layer and intracuticular 3 Monocryl in the skin followed by Dermabond and Aquasol dressing right and was placed in a sling and the patient was awakened extubated and taken to the recovery room in stable condition  Ralene Bathe, PA-C was used as an Geophysicist/field seismologist throughout this case essential for help with positioning of the patient retraction implantation of the prosthesis wound closure and intraoperative decision-making.  Vania Rea Olanna Percifield MD   Contact # [phone]

## 2018-09-06 NOTE — Discharge Instructions (Signed)
Kristi Gamble, M.D., F.A.A.O.S. Orthopaedic Surgery Specializing in Arthroscopic and Reconstructive Surgery of the Shoulder and Knee [phone] [address] [place], [place] [address] - Fax [phone]   POST-OP TOTAL SHOULDER REPLACEMENT INSTRUCTIONS  1. Call the office at [phone] to schedule your first post-op appointment 10-14 days from the date of your surgery.  2. The bandage over your incision is waterproof. You may begin showering with this dressing on. You may leave this dressing on until first follow up appointment within 2 weeks. We prefer you leave this dressing in place until follow up however after 5-7 days if you are having itching or skin irritation and would like to remove it you may do so. Go slow and tug at the borders gently to break the bond the dressing has with the skin. At this point if there is no drainage it is okay to go without a bandage or you may cover it with a light guaze and tape. You can also expect significant bruising around your shoulder that will drift down your arm and into your chest wall. This is very normal and should resolve over several days.   3. Wear your sling/immobilizer at all times except to perform the exercises below or to occasionally let your arm dangle by your side to stretch your elbow. You also need to sleep in your sling immobilizer until instructed otherwise. After your block has worn off and you regain more control of your arm, you can remove and discontinue use of the waist strap portion of your sling. You can also remove sling if you are in a controlled environment to give your skin and neck a break. You need to wear it if you are up walking around, and when you go to sleep at night.  4. Range of motion to your elbow, wrist, and hand are encouraged 3-5 times daily. Exercise to your hand and fingers helps to reduce swelling you may experience.  5. Utilize ice to the shoulder 3-5 times minimum a day and  additionally if you are experiencing pain.  6. Prescriptions for a pain medication and a muscle relaxant are provided for you. It is recommended that if you are experiencing pain that you pain medication alone is not controlling, add the muscle relaxant along with the pain medication which can give additional pain relief. The first 1-2 days is generally the most severe of your pain and then should gradually decrease. As your pain lessens it is recommended that you decrease your use of the pain medications to an "as needed basis'" only and to always comply with the recommended dosages of the pain medications.  7. Pain medications can produce constipation along with their use. If you experience this, the use of an over the counter stool softener or laxative daily is recommended.   8. For additional questions or concerns, please do not hesitate to call the office. If after hours there is an answering service to forward your concerns to the physician on call.  9.Pain control following an exparel block  To help control your post-operative pain you received a nerve block  performed with Exparel which is a long acting anesthetic (numbing agent) which can provide pain relief and sensations of numbness (and relief of pain) in the operative shoulder and arm for up to 3 days. Sometimes it provides mixed relief, meaning you may still have numbness in certain areas of the arm but can still be able to move  parts of that arm,  hand, and fingers. We recommend that your prescribed pain medications  be used as needed. We do not feel it is necessary to "pre medicate" and "stay ahead" of pain.  Taking narcotic pain medications when you are not having any pain can lead to unnecessary and potentially dangerous side effects.   POST-OP EXERCISES  Pendulum Exercises  Perform pendulum exercises while standing and bending at the waist. Support your uninvolved arm on a table or chair and allow your operated arm to hang freely.  Make sure to do these exercises passively - not using you shoulder muscles.  Repeat 20 times. Do 3 sessions per day.

## 2018-09-06 NOTE — H&P (Signed)
Kristi Gamble    Chief Complaint: right shoulder rotator cuff tear arthropathy HPI: The patient is a 63 y.o. female with end stage right shoulder rotator cuff tear arthropathy  Past Medical History:  Diagnosis Date  . 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 Benign essential tremor 2008  . Bilateral headaches   . GERD (gastroesophageal reflux disease)   . Peripheral vertigo     Past Surgical History:  Procedure Laterality Date  . CESAREAN SECTION     x4  . COLONOSCOPY  01/30/09   normal recxheck in 10 years    Family History  Problem Relation Age of Onset  . Coronary artery disease Father   . Heart failure Father   . Stroke Paternal Grandfather   . Heart failure Maternal Grandmother     Social History:  reports that she has never smoked. She has never used smokeless tobacco. She reports that she does not drink alcohol or use drugs.   Medications Prior to Admission  Medication Sig Dispense Refill  . BELBUCA 75 MCG FILM Place 75 mcg inside cheek at bedtime.   0  . oxyCODONE-acetaminophen (PERCOCET/ROXICET) 5-325 MG tablet Take 1 tablet by mouth 3 (three) times daily as needed for severe pain.    Marland Kitchen trimethoprim (TRIMPEX) 100 MG tablet Take 100 mg by mouth daily.       Physical Exam: right shoulder with painful and restricted motion as noted at recent office visits  Vitals  Temp:  [98.2 F (36.8 C)] 98.2 F (36.8 C) (01/16 0803) Pulse Rate:  [72] 72 (01/16 0803) Resp:  [16] 16 (01/16 0803) BP: (117)/(71) 117/71 (01/16 0803) SpO2:  [98 %] 98 % (01/16 0803) Weight:  [61.6 kg] 61.6 kg (01/16 0803)  Assessment/Plan  Impression: right shoulder rotator cuff tear arthropathy  Plan of Action: Procedure(s): REVERSE SHOULDER ARTHROPLASTY  Kaiser Belluomini M Dillard Pascal 09/06/2018, 9:36 AM Contact # [phone]

## 2018-09-06 NOTE — Anesthesia Procedure Notes (Addendum)
Anesthesia Regional Block: Interscalene brachial plexus block   Pre-Anesthetic Checklist: ,, timeout performed, Correct Patient, Correct Site, Correct Laterality, Correct Procedure, Correct Position, site marked, Risks and benefits discussed,  Surgical consent,  Pre-op evaluation,  At surgeon's request and post-op pain management  Laterality: Right and Upper  Prep: chloraprep       Needles:  Injection technique: Single-shot     Needle Length: 5cm  Needle Gauge: 22     Additional Needles: Arrow StimuQuik ECHO Echogenic Stimulating PNB Needle  Procedures:,,,, ultrasound used (permanent image in chart),,,,  Narrative:  Start time: 09/06/2018 10:01 AM End time: 09/06/2018 10:06 AM Injection made incrementally with aspirations every 5 mL.  Performed by: Personally  Anesthesiologist: Val Eagle, MD

## 2018-09-11 ENCOUNTER — Encounter (HOSPITAL_COMMUNITY): Payer: Self-pay | Admitting: Orthopedic Surgery

## 2018-09-17 NOTE — Anesthesia Postprocedure Evaluation (Signed)
Anesthesia Post Note  Patient: Kristi Gamble  Procedure(s) Performed: REVERSE SHOULDER ARTHROPLASTY (Right Shoulder)     Patient location during evaluation: PACU Anesthesia Type: Regional and General Level of consciousness: awake and alert Pain management: pain level controlled Vital Signs Assessment: post-procedure vital signs reviewed and stable Respiratory status: spontaneous breathing, nonlabored ventilation, respiratory function stable and patient connected to nasal cannula oxygen Cardiovascular status: blood pressure returned to baseline and stable Postop Assessment: no apparent nausea or vomiting Anesthetic complications: no    Last Vitals:  Vitals:   09/06/18 1315 09/06/18 1415  BP: 117/64 126/84  Pulse: 71 (!) 57  Resp: 18 16  Temp: 36.5 C 36.6 C  SpO2: 94% 97%    Last Pain:  Vitals:   09/07/18 1156  TempSrc:   PainSc: 3                  Khadija Thier

## 2018-09-28 ENCOUNTER — Telehealth: Payer: Self-pay | Admitting: Certified Nurse Midwife

## 2018-09-28 NOTE — Telephone Encounter (Signed)
Patient canceled her lab appointment 10/02/18 and will call later to reschedule after recuperating from shoulder surgery.

## 2018-10-02 ENCOUNTER — Other Ambulatory Visit: Payer: BLUE CROSS/BLUE SHIELD

## 2018-11-13 NOTE — Telephone Encounter (Signed)
Okay to close encounter?  °

## 2019-03-15 ENCOUNTER — Encounter: Payer: Self-pay | Admitting: Certified Nurse Midwife

## 2019-04-01 ENCOUNTER — Other Ambulatory Visit: Payer: Self-pay

## 2019-04-02 NOTE — Progress Notes (Signed)
63 y.o. [id number] Widowed  Caucasian Fe here for annual exam. Menopausal no HRT,Denies vaginal bleeding. Seeing pain management MD now and sees him prn. Coping with Covid 19 the best she can. Some vaginal moisture and irritation off and on. No new personal products, not sexually active. No other health issues today.   Patient's last menstrual period was 06/22/2008 (approximate).          Sexually active: No.  The current method of family planning is tubal ligation.    Exercising: Yes.    at work Smoker:  no  Review of Systems  Constitutional: Negative.   HENT: Negative.   Eyes: Negative.   Respiratory: Negative.   Cardiovascular: Negative.   Gastrointestinal: Negative.   Genitourinary: Negative.   Musculoskeletal: Negative.   Skin: Negative.   Neurological: Negative.   Endo/Heme/Allergies: Negative.   Psychiatric/Behavioral: Negative.     Health Maintenance: Pap:  03-22-2017 neg History of Abnormal Pap: CIN1 MMG:  03-02-18 category c density birads 1:neg, 02/2019 neg per patient Self Breast exams: yes Colonoscopy:  2010 f/u 4938yrs BMD:  2018 normal f/u 1056yrs TDaP:  2010 Shingles: not done Pneumonia: not done Hep C and HIV: both neg 2017 Labs: yes   reports that she has never smoked. She has never used smokeless tobacco. She reports that she does not drink alcohol or use drugs.  Past Medical History:  Diagnosis Date  . 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 Benign essential tremor 2008  . Bilateral headaches   . GERD (gastroesophageal reflux disease)   . Peripheral vertigo     Past Surgical History:  Procedure Laterality Date  . CESAREAN SECTION     x4  . COLONOSCOPY  01/30/09   normal recxheck in 10 years  . REVERSE SHOULDER ARTHROPLASTY Right 09/06/2018   Procedure: REVERSE SHOULDER ARTHROPLASTY;  Surgeon: Francena HanlySupple, Kevin, MD;  Location: [facility];  Service: Orthopedics;  Laterality: Right;    Current Outpatient Medications  Medication Sig Dispense Refill  . BELBUCA 75  MCG FILM Place 75 mcg inside cheek at bedtime.   0  . cyclobenzaprine (FLEXERIL) 10 MG tablet Take 1 tablet (10 mg total) by mouth 3 (three) times daily as needed for muscle spasms. 30 tablet 1  . naproxen (NAPROSYN) 500 MG tablet Take 1 tablet (500 mg total) by mouth 2 (two) times daily with a meal. 60 tablet 1  . ondansetron (ZOFRAN) 4 MG tablet Take 1 tablet (4 mg total) by mouth every 8 (eight) hours as needed for nausea or vomiting. 10 tablet 0  . oxyCODONE-acetaminophen (PERCOCET/ROXICET) 5-325 MG tablet Take 1 tablet by mouth every 4 (four) hours as needed for severe pain. 30 tablet 0  . trimethoprim (TRIMPEX) 100 MG tablet Take 100 mg by mouth daily.     No current facility-administered medications for this visit.     Family History  Problem Relation Age of Onset  . Coronary artery disease Father   . Heart failure Father   . Stroke Paternal Grandfather   . Heart failure Maternal Grandmother     ROS:  Pertinent items are noted in HPI.  Otherwise, a comprehensive ROS was negative.  Exam:   BP 110/70   Pulse 68   Temp (!) 97.3 F (36.3 C) (Skin)   Resp 16   Ht 5' 2.75" (1.594 m)   Wt 139 lb (63 kg)   LMP 06/22/2008 (Approximate)   BMI 24.82 kg/m  Height: 5' 2.75" (159.4 cm) Ht Readings from  Last 3 Encounters:  04/03/19 5' 2.75" (1.594 m)  09/06/18 5\' 3"  (1.6 m)  09/03/18 5\' 3"  (1.6 m)    General appearance: alert, cooperative and appears stated age Head: Normocephalic, without obvious abnormality, atraumatic Neck: no adenopathy, supple, symmetrical, trachea midline and thyroid normal to inspection and palpation Lungs: clear to auscultation bilaterally Breasts: normal appearance, no masses or tenderness, No nipple retraction or dimpling, No nipple discharge or bleeding, No axillary or supraclavicular adenopathy Heart: regular rate and rhythm Abdomen: soft, non-tender; no masses,  no organomegaly Extremities: extremities normal, atraumatic, no cyanosis or edema Skin:  Skin color, texture, turgor normal. No rashes or lesions Lymph nodes: Cervical, supraclavicular, and axillary nodes normal. No abnormal inguinal nodes palpated Neurologic: Grossly normal   Pelvic: External genitalia:  no lesions, scaling and redness noted,non tender              Urethra:  normal appearing urethra with no masses, tenderness or lesions              Bartholin's and Skene's: normal                 Vagina: normal appearing vagina with normal color and  White watery non odorous discharge, no lesions, Affirm taken              Cervix: no cervical motion tenderness, no lesions and normal appearance              Pap taken: No. Bimanual Exam:  Uterus:  normal size, contour, position, consistency, mobility, non-tender and anteverted              Adnexa: normal adnexa and no mass, fullness, tenderness               Rectovaginal: Confirms               Anus:  normal sphincter tone, small hemorrhoid at anal entrance, not thrombosed or tender  Chaperone present: yes  A:  Well Woman with normal exam  Post menopausal no HRT  Vaginal irritation appearance R/O vaginal infection  Pain management in progress with MD  Colonoscopy due this year  Screening labs  P:   Reviewed health and wellness pertinent to exam  Discussed vaginal dryness is management if occurs and notify if vaginal bleeding.  Discussed comfort measures with baking soda tub bath. Change underwear if becomes moist. Will treat if indicated. Lab: affirm  Continue follow up as indicated  Patient aware and will schedule  Labs: CBC,CMP, Lipid panel, TSH, Vitamin D  Pap smear: no   counseled on breast self exam, mammography screening, adequate intake of calcium and vitamin D, diet and exercise, Kegel's exercises  return annually or prn  An After Visit Summary was printed and given to the patient.

## 2019-04-03 ENCOUNTER — Encounter: Payer: Self-pay | Admitting: Certified Nurse Midwife

## 2019-04-03 ENCOUNTER — Ambulatory Visit (INDEPENDENT_AMBULATORY_CARE_PROVIDER_SITE_OTHER): Payer: BC Managed Care – PPO | Admitting: Certified Nurse Midwife

## 2019-04-03 ENCOUNTER — Other Ambulatory Visit: Payer: Self-pay

## 2019-04-03 VITALS — BP 110/70 | HR 68 | Temp 97.3°F | Resp 16 | Ht 62.75 in | Wt 139.0 lb

## 2019-04-03 DIAGNOSIS — N898 Other specified noninflammatory disorders of vagina: Secondary | ICD-10-CM | POA: Diagnosis not present

## 2019-04-03 DIAGNOSIS — E559 Vitamin D deficiency, unspecified: Secondary | ICD-10-CM

## 2019-04-03 DIAGNOSIS — Z01419 Encounter for gynecological examination (general) (routine) without abnormal findings: Secondary | ICD-10-CM | POA: Diagnosis not present

## 2019-04-03 DIAGNOSIS — Z0000 Encounter for general adult medical examination without abnormal findings: Secondary | ICD-10-CM

## 2019-04-03 NOTE — Patient Instructions (Signed)

## 2019-04-04 LAB — COMPREHENSIVE METABOLIC PANEL
ALT: 15 IU/L (ref 0–32)
AST: 15 IU/L (ref 0–40)
Albumin/Globulin Ratio: 1.7 (ref 1.2–2.2)
Albumin: 4.3 g/dL (ref 3.8–4.8)
Alkaline Phosphatase: 74 IU/L (ref 39–117)
BUN/Creatinine Ratio: 18 (ref 12–28)
BUN: 16 mg/dL (ref 8–27)
Bilirubin Total: 0.3 mg/dL (ref 0.0–1.2)
CO2: 22 mmol/L (ref 20–29)
Calcium: 9.8 mg/dL (ref 8.7–10.3)
Chloride: 104 mmol/L (ref 96–106)
Creatinine, Ser: 0.87 mg/dL (ref 0.57–1.00)
GFR calc Af Amer: 81 mL/min/{1.73_m2} (ref >59)
GFR calc non Af Amer: 70 mL/min/{1.73_m2} (ref >59)
Globulin, Total: 2.6 g/dL (ref 1.5–4.5)
Glucose: 91 mg/dL (ref 65–99)
Potassium: 4.2 mmol/L (ref 3.5–5.2)
Sodium: 143 mmol/L (ref 134–144)
Total Protein: 6.9 g/dL (ref 6.0–8.5)

## 2019-04-04 LAB — CBC
Hematocrit: 43.9 % (ref 34.0–46.6)
Hemoglobin: 14.3 g/dL (ref 11.1–15.9)
MCH: 31.8 pg (ref 26.6–33.0)
MCHC: 32.6 g/dL (ref 31.5–35.7)
MCV: 98 fL — ABNORMAL HIGH (ref 79–97)
Platelets: 301 10*3/uL (ref 150–450)
RBC: 4.5 x10E6/uL (ref 3.77–5.28)
RDW: 13.1 % (ref 11.7–15.4)
WBC: 4.6 10*3/uL (ref 3.4–10.8)

## 2019-04-04 LAB — VAGINITIS/VAGINOSIS, DNA PROBE
Candida Species: NEGATIVE
Gardnerella vaginalis: NEGATIVE
Trichomonas vaginosis: NEGATIVE

## 2019-04-04 LAB — VITAMIN D 25 HYDROXY (VIT D DEFICIENCY, FRACTURES): Vit D, 25-Hydroxy: 49.2 ng/mL (ref 30.0–100.0)

## 2019-04-04 LAB — LIPID PANEL
Chol/HDL Ratio: 3.6 ratio (ref 0.0–4.4)
Cholesterol, Total: 199 mg/dL (ref 100–199)
HDL: 56 mg/dL (ref >39)
LDL Calculated: 122 mg/dL — ABNORMAL HIGH (ref 0–99)
Triglycerides: 107 mg/dL (ref 0–149)
VLDL Cholesterol Cal: 21 mg/dL (ref 5–40)

## 2019-04-04 LAB — TSH: TSH: 2.28 u[IU]/mL (ref 0.450–4.500)

## 2019-09-05 IMAGING — CR DG SHOULDER 2+V*R*
3 series · 3 of 3 positions shown · non-contrast
Comparison: None.

CLINICAL DATA: Lower neck pain, bilateral shoulder pain

EXAM:
RIGHT SHOULDER - 2+ VIEW

[w shoulder external right (1 of 2)]
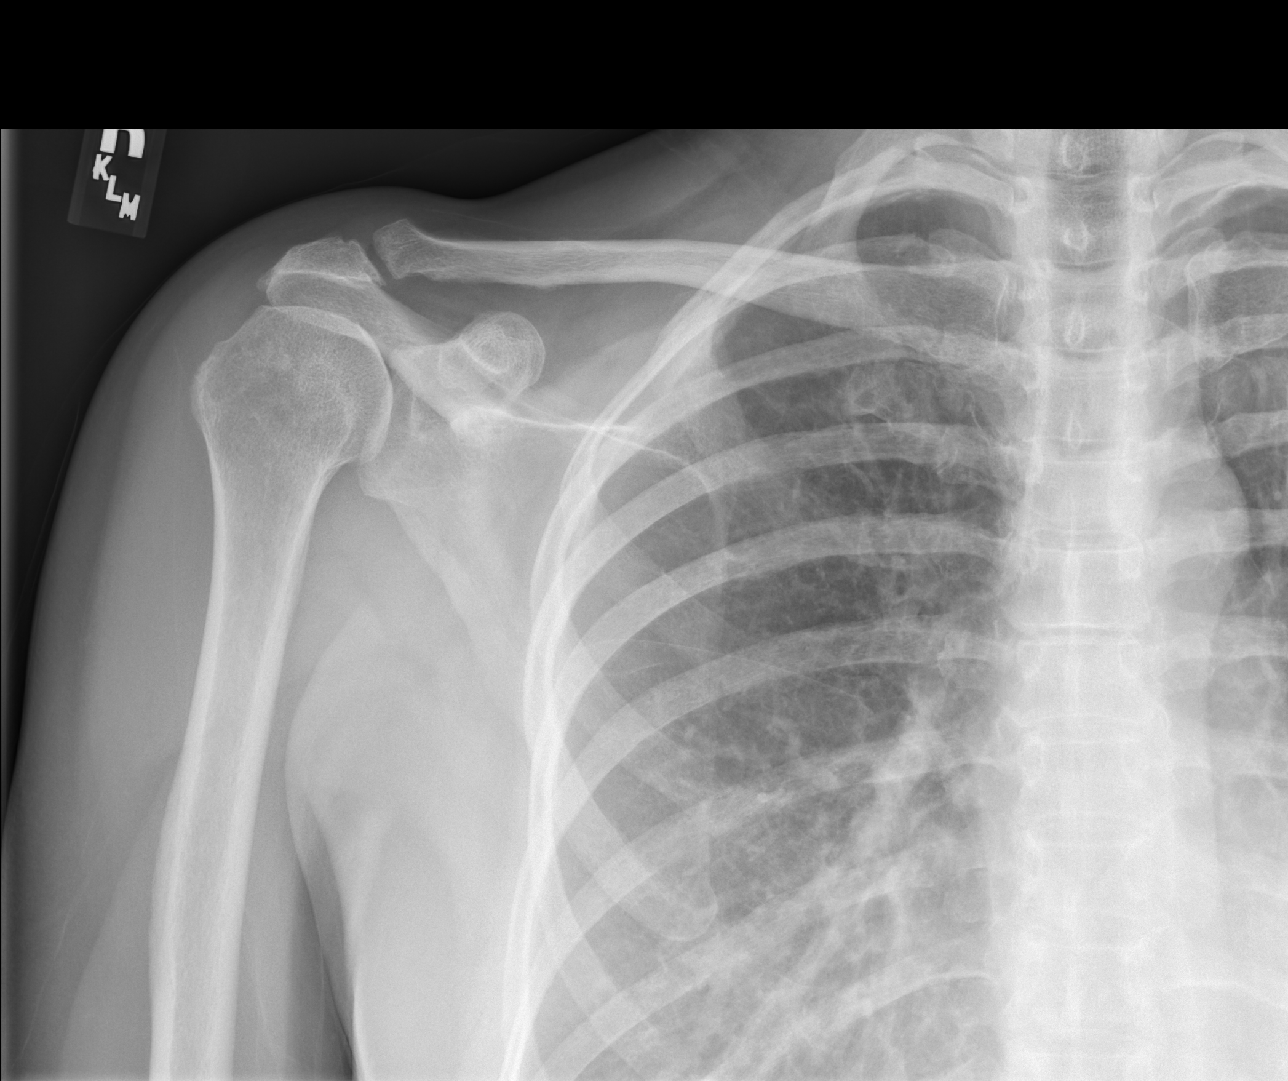

[w shoulder external right (2 of 2)]
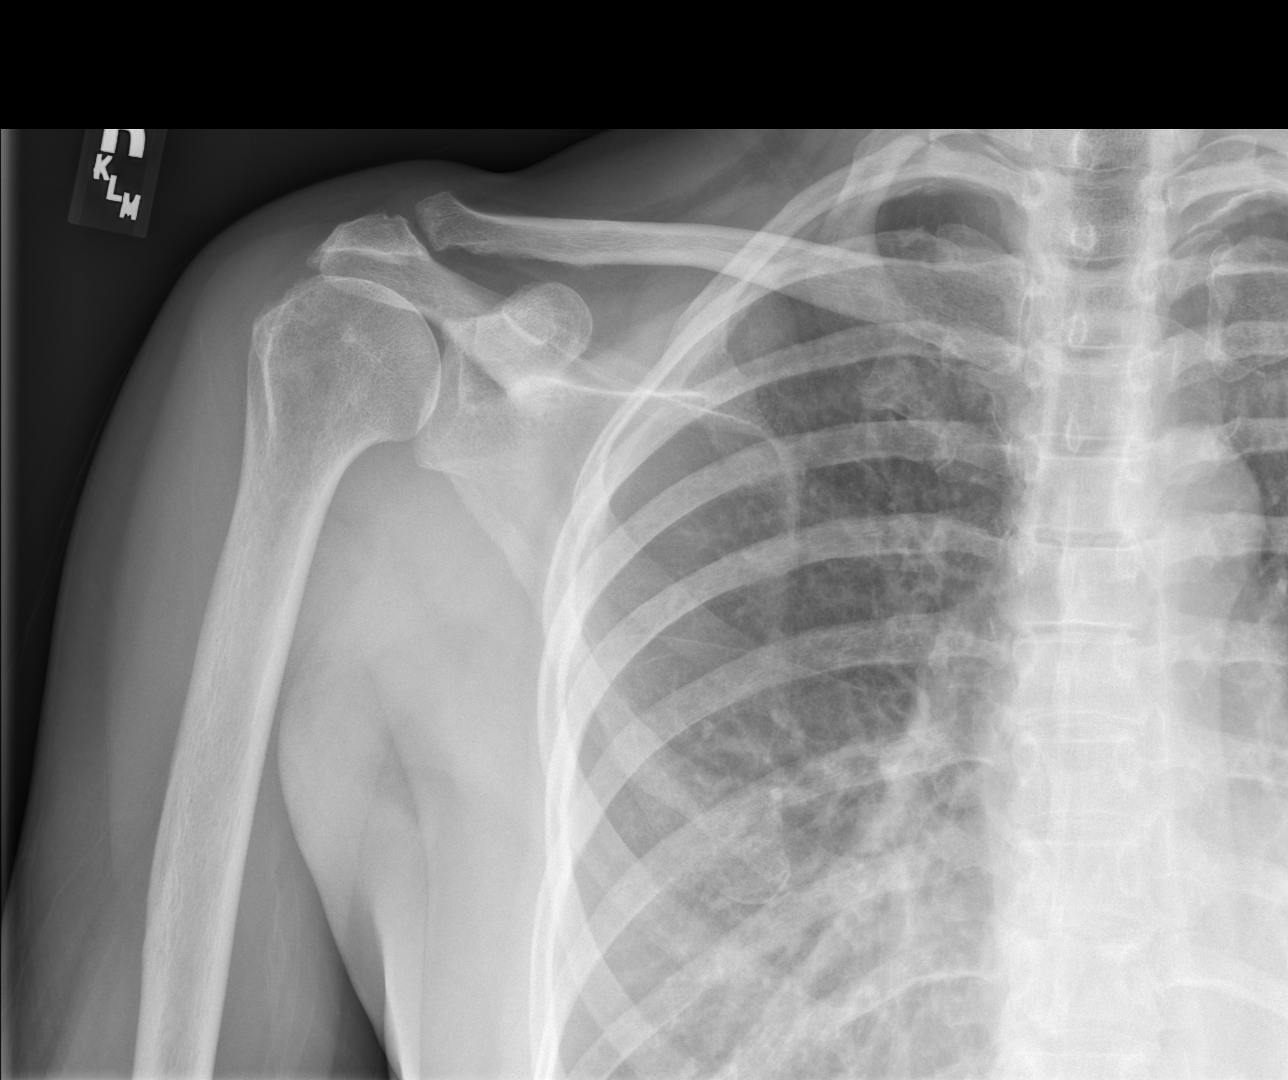

[w shoulder internal right]
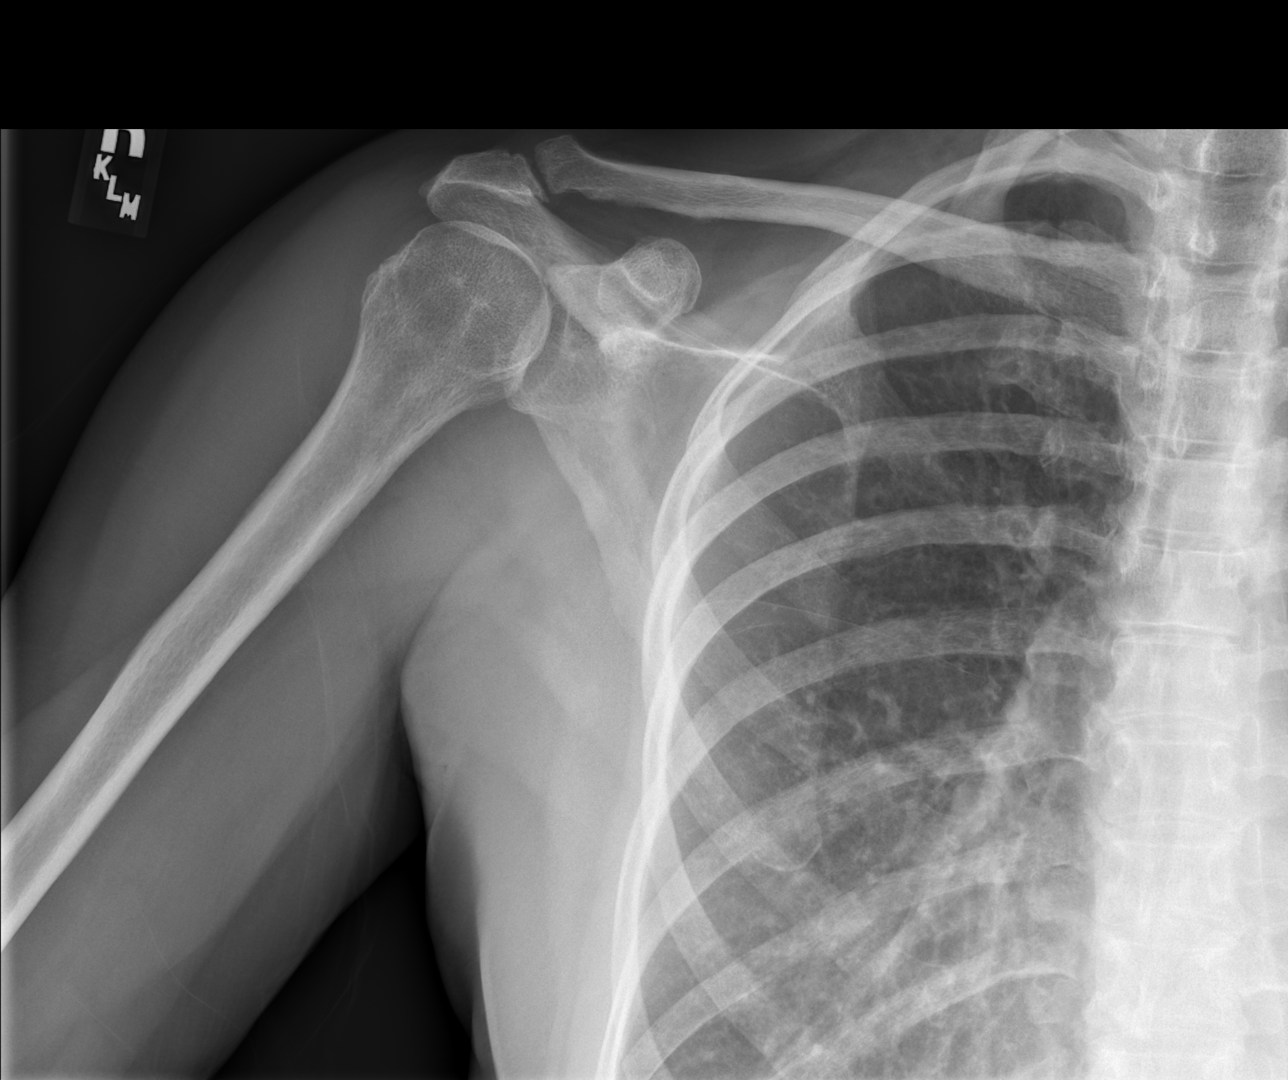

[3 of 3 positions shown; findings below may reference images not displayed]

FINDINGS: There is no fracture or dislocation. There are mild degenerative
changes of the acromioclavicular joint.
IMPRESSION: No acute osseous injury of the right shoulder.

## 2019-11-13 ENCOUNTER — Encounter: Payer: Self-pay | Admitting: Certified Nurse Midwife

## 2019-12-04 DIAGNOSIS — Z79899 Other long term (current) drug therapy: Secondary | ICD-10-CM | POA: Diagnosis not present

## 2019-12-04 DIAGNOSIS — M25511 Pain in right shoulder: Secondary | ICD-10-CM | POA: Diagnosis not present

## 2019-12-16 DIAGNOSIS — M5116 Intervertebral disc disorders with radiculopathy, lumbar region: Secondary | ICD-10-CM | POA: Diagnosis not present

## 2019-12-16 DIAGNOSIS — M9903 Segmental and somatic dysfunction of lumbar region: Secondary | ICD-10-CM | POA: Diagnosis not present

## 2019-12-16 DIAGNOSIS — M9902 Segmental and somatic dysfunction of thoracic region: Secondary | ICD-10-CM | POA: Diagnosis not present

## 2019-12-16 DIAGNOSIS — M9904 Segmental and somatic dysfunction of sacral region: Secondary | ICD-10-CM | POA: Diagnosis not present

## 2019-12-18 DIAGNOSIS — M9902 Segmental and somatic dysfunction of thoracic region: Secondary | ICD-10-CM | POA: Diagnosis not present

## 2019-12-18 DIAGNOSIS — M9903 Segmental and somatic dysfunction of lumbar region: Secondary | ICD-10-CM | POA: Diagnosis not present

## 2019-12-18 DIAGNOSIS — M9904 Segmental and somatic dysfunction of sacral region: Secondary | ICD-10-CM | POA: Diagnosis not present

## 2019-12-18 DIAGNOSIS — M5116 Intervertebral disc disorders with radiculopathy, lumbar region: Secondary | ICD-10-CM | POA: Diagnosis not present

## 2019-12-19 DIAGNOSIS — M9903 Segmental and somatic dysfunction of lumbar region: Secondary | ICD-10-CM | POA: Diagnosis not present

## 2019-12-19 DIAGNOSIS — M5116 Intervertebral disc disorders with radiculopathy, lumbar region: Secondary | ICD-10-CM | POA: Diagnosis not present

## 2019-12-19 DIAGNOSIS — M9902 Segmental and somatic dysfunction of thoracic region: Secondary | ICD-10-CM | POA: Diagnosis not present

## 2019-12-19 DIAGNOSIS — M9904 Segmental and somatic dysfunction of sacral region: Secondary | ICD-10-CM | POA: Diagnosis not present

## 2019-12-30 DIAGNOSIS — M5116 Intervertebral disc disorders with radiculopathy, lumbar region: Secondary | ICD-10-CM | POA: Diagnosis not present

## 2019-12-30 DIAGNOSIS — M9903 Segmental and somatic dysfunction of lumbar region: Secondary | ICD-10-CM | POA: Diagnosis not present

## 2019-12-30 DIAGNOSIS — M9902 Segmental and somatic dysfunction of thoracic region: Secondary | ICD-10-CM | POA: Diagnosis not present

## 2019-12-30 DIAGNOSIS — M9904 Segmental and somatic dysfunction of sacral region: Secondary | ICD-10-CM | POA: Diagnosis not present

## 2020-01-02 DIAGNOSIS — M9902 Segmental and somatic dysfunction of thoracic region: Secondary | ICD-10-CM | POA: Diagnosis not present

## 2020-01-02 DIAGNOSIS — M9903 Segmental and somatic dysfunction of lumbar region: Secondary | ICD-10-CM | POA: Diagnosis not present

## 2020-01-02 DIAGNOSIS — M9904 Segmental and somatic dysfunction of sacral region: Secondary | ICD-10-CM | POA: Diagnosis not present

## 2020-01-02 DIAGNOSIS — M5116 Intervertebral disc disorders with radiculopathy, lumbar region: Secondary | ICD-10-CM | POA: Diagnosis not present

## 2020-01-03 DIAGNOSIS — M25511 Pain in right shoulder: Secondary | ICD-10-CM | POA: Diagnosis not present

## 2020-01-03 DIAGNOSIS — G8929 Other chronic pain: Secondary | ICD-10-CM | POA: Diagnosis not present

## 2020-01-03 DIAGNOSIS — Z79899 Other long term (current) drug therapy: Secondary | ICD-10-CM | POA: Diagnosis not present

## 2020-01-03 DIAGNOSIS — M545 Low back pain: Secondary | ICD-10-CM | POA: Diagnosis not present

## 2020-01-06 DIAGNOSIS — M9904 Segmental and somatic dysfunction of sacral region: Secondary | ICD-10-CM | POA: Diagnosis not present

## 2020-01-06 DIAGNOSIS — M5116 Intervertebral disc disorders with radiculopathy, lumbar region: Secondary | ICD-10-CM | POA: Diagnosis not present

## 2020-01-06 DIAGNOSIS — M9903 Segmental and somatic dysfunction of lumbar region: Secondary | ICD-10-CM | POA: Diagnosis not present

## 2020-01-06 DIAGNOSIS — M9902 Segmental and somatic dysfunction of thoracic region: Secondary | ICD-10-CM | POA: Diagnosis not present

## 2020-01-09 DIAGNOSIS — M9902 Segmental and somatic dysfunction of thoracic region: Secondary | ICD-10-CM | POA: Diagnosis not present

## 2020-01-09 DIAGNOSIS — M5116 Intervertebral disc disorders with radiculopathy, lumbar region: Secondary | ICD-10-CM | POA: Diagnosis not present

## 2020-01-09 DIAGNOSIS — M9903 Segmental and somatic dysfunction of lumbar region: Secondary | ICD-10-CM | POA: Diagnosis not present

## 2020-01-09 DIAGNOSIS — M9904 Segmental and somatic dysfunction of sacral region: Secondary | ICD-10-CM | POA: Diagnosis not present

## 2020-01-15 DIAGNOSIS — M9904 Segmental and somatic dysfunction of sacral region: Secondary | ICD-10-CM | POA: Diagnosis not present

## 2020-01-15 DIAGNOSIS — M9903 Segmental and somatic dysfunction of lumbar region: Secondary | ICD-10-CM | POA: Diagnosis not present

## 2020-01-15 DIAGNOSIS — M5116 Intervertebral disc disorders with radiculopathy, lumbar region: Secondary | ICD-10-CM | POA: Diagnosis not present

## 2020-01-15 DIAGNOSIS — M9902 Segmental and somatic dysfunction of thoracic region: Secondary | ICD-10-CM | POA: Diagnosis not present

## 2020-01-22 DIAGNOSIS — M9903 Segmental and somatic dysfunction of lumbar region: Secondary | ICD-10-CM | POA: Diagnosis not present

## 2020-01-22 DIAGNOSIS — M9904 Segmental and somatic dysfunction of sacral region: Secondary | ICD-10-CM | POA: Diagnosis not present

## 2020-01-22 DIAGNOSIS — M9902 Segmental and somatic dysfunction of thoracic region: Secondary | ICD-10-CM | POA: Diagnosis not present

## 2020-01-22 DIAGNOSIS — M5116 Intervertebral disc disorders with radiculopathy, lumbar region: Secondary | ICD-10-CM | POA: Diagnosis not present

## 2020-01-31 DIAGNOSIS — Z79899 Other long term (current) drug therapy: Secondary | ICD-10-CM | POA: Diagnosis not present

## 2020-01-31 DIAGNOSIS — M25511 Pain in right shoulder: Secondary | ICD-10-CM | POA: Diagnosis not present

## 2020-02-28 DIAGNOSIS — G8929 Other chronic pain: Secondary | ICD-10-CM | POA: Diagnosis not present

## 2020-02-28 DIAGNOSIS — M545 Low back pain: Secondary | ICD-10-CM | POA: Diagnosis not present

## 2020-02-28 DIAGNOSIS — M25511 Pain in right shoulder: Secondary | ICD-10-CM | POA: Diagnosis not present

## 2020-02-28 DIAGNOSIS — Z79899 Other long term (current) drug therapy: Secondary | ICD-10-CM | POA: Diagnosis not present

## 2020-03-20 DIAGNOSIS — Z1231 Encounter for screening mammogram for malignant neoplasm of breast: Secondary | ICD-10-CM | POA: Diagnosis not present

## 2020-03-30 DIAGNOSIS — Z79899 Other long term (current) drug therapy: Secondary | ICD-10-CM | POA: Diagnosis not present

## 2020-03-30 DIAGNOSIS — M545 Low back pain: Secondary | ICD-10-CM | POA: Diagnosis not present

## 2020-03-30 DIAGNOSIS — M25511 Pain in right shoulder: Secondary | ICD-10-CM | POA: Diagnosis not present

## 2020-04-03 ENCOUNTER — Ambulatory Visit: Payer: BC Managed Care – PPO | Admitting: Certified Nurse Midwife

## 2020-04-14 NOTE — Progress Notes (Signed)
64 y.o. G48P4003 Widowed White or Caucasian Not Hispanic or Latino female here for annual exam.   No vaginal bleeding.  Rare GSI.    Patient's last menstrual period was 06/22/2008 (approximate).          Sexually active: No.  The current method of family planning is post menopausal status.    Exercising: Yes.    walking  Smoker:  no  Health Maintenance: Pap:  03/22/17 neg, no hpv testing.   History of abnormal Pap:  Yes CIN1 6-7 years ago.  MMG:  03/15/19 Bi-rads 1 neg, recently done and normal per patient.    BMD:   2018 normal f/u 3 years  Colonoscopy:2010 f/u 10 years, due   TDaP:  09/23/09 Gardasil: NA   reports that she has never smoked. She has never used smokeless tobacco. She reports that she does not drink alcohol and does not use drugs. Working as Office manager at [employer], part time.  3 kids, 6 grand children. Daughter and one grandchild are here, other kids are in [place].   Past Medical History:  Diagnosis Date  . 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 Benign essential tremor 2008  . Bilateral headaches   . GERD (gastroesophageal reflux disease)   . Peripheral vertigo     Past Surgical History:  Procedure Laterality Date  . CESAREAN SECTION     x4  . COLONOSCOPY  01/30/09   normal recxheck in 10 years  . REVERSE SHOULDER ARTHROPLASTY Right 09/06/2018   Procedure: REVERSE SHOULDER ARTHROPLASTY;  Surgeon: Francena Hanly, MD;  Location: [facility];  Service: Orthopedics;  Laterality: Right;    Current Outpatient Medications  Medication Sig Dispense Refill  . oxyCODONE-acetaminophen (PERCOCET/ROXICET) 5-325 MG tablet Take 1 tablet by mouth every 4 (four) hours as needed for severe pain. 30 tablet 0  . gabapentin (NEURONTIN) 100 MG capsule Take 100 mg by mouth 4 (four) times daily as needed.     No current facility-administered medications for this visit.  Chronic back pain, takes percocet. Has pain radiating down her leg, take gabapentin.  Family History  Problem  Relation Age of Onset  . Coronary artery disease Father   . Heart failure Father   . Stroke Paternal Grandfather   . Heart failure Maternal Grandmother     Review of Systems  All other systems reviewed and are negative.   Exam:   BP 122/72   Pulse 71   Ht 5' 2.25" (1.581 m)   Wt 145 lb (65.8 kg)   LMP 06/22/2008 (Approximate)   SpO2 98%   BMI 26.31 kg/m   Weight change: @WEIGHTCHANGE @ Height:   Height: 5' 2.25" (158.1 cm)  Ht Readings from Last 3 Encounters:  04/16/20 5' 2.25" (1.581 m)  04/03/19 5' 2.75" (1.594 m)  09/06/18 5\' 3"  (1.6 m)    General appearance: alert, cooperative and appears stated age Head: Normocephalic, without obvious abnormality, atraumatic Neck: no adenopathy, supple, symmetrical, trachea midline and thyroid normal to inspection and palpation Lungs: clear to auscultation bilaterally Cardiovascular: regular rate and rhythm Breasts: normal appearance, no masses or tenderness Abdomen: soft, non-tender; non distended,  no masses,  no organomegaly Extremities: extremities normal, atraumatic, no cyanosis or edema Skin: Skin color, texture, turgor normal. No rashes or lesions Lymph nodes: Cervical, supraclavicular, and axillary nodes normal. No abnormal inguinal nodes palpated Neurologic: Grossly normal   Pelvic: External genitalia:  no lesions  Urethra:  normal appearing urethra with no masses, tenderness or lesions              Bartholins and Skenes: normal                 Vagina: normal appearing vagina with normal color and discharge, no lesions              Cervix: no lesions               Bimanual Exam:  Uterus:  normal size, contour, position, consistency, mobility, non-tender              Adnexa: no mass, fullness, tenderness               Rectovaginal: Confirms               Anus:  normal sphincter tone, no lesions  Shanon Petty chaperoned for the exam.  A:  Well Woman with normal exam  P:   Pap with reflex hpv  Discussed  breast self exam  Discussed calcium and vit D intake  Get a copy of her mammogram

## 2020-04-16 ENCOUNTER — Other Ambulatory Visit (HOSPITAL_COMMUNITY)
Admission: RE | Admit: 2020-04-16 | Discharge: 2020-04-16 | Disposition: A | Discharge status: Home / Self Care | Payer: BLUE CROSS/BLUE SHIELD | Source: Ambulatory Visit | Attending: Obstetrics and Gynecology | Admitting: Obstetrics and Gynecology

## 2020-04-16 ENCOUNTER — Ambulatory Visit (INDEPENDENT_AMBULATORY_CARE_PROVIDER_SITE_OTHER): Payer: PPO | Admitting: Obstetrics and Gynecology

## 2020-04-16 ENCOUNTER — Other Ambulatory Visit: Payer: Self-pay

## 2020-04-16 ENCOUNTER — Encounter: Payer: Self-pay | Admitting: Obstetrics and Gynecology

## 2020-04-16 VITALS — BP 122/72 | HR 71 | Ht 62.25 in | Wt 145.0 lb

## 2020-04-16 DIAGNOSIS — Z01419 Encounter for gynecological examination (general) (routine) without abnormal findings: Secondary | ICD-10-CM

## 2020-04-16 DIAGNOSIS — Z124 Encounter for screening for malignant neoplasm of cervix: Secondary | ICD-10-CM | POA: Insufficient documentation

## 2020-04-16 NOTE — Patient Instructions (Signed)
EXERCISE AND DIET:  We recommended that you start or continue a regular exercise program for good health. Regular exercise means any activity that makes your heart beat faster and makes you sweat.  We recommend exercising at least 30 minutes per day at least 3 days a week, preferably 4 or 5.  We also recommend a diet low in fat and sugar.  Inactivity, poor dietary choices and obesity can cause diabetes, heart attack, stroke, and kidney damage, among others.    ALCOHOL AND SMOKING:  Women should limit their alcohol intake to no more than 7 drinks/beers/glasses of wine (combined, not each!) per week. Moderation of alcohol intake to this level decreases your risk of breast cancer and liver damage. And of course, no recreational drugs are part of a healthy lifestyle.  And absolutely no smoking or even second hand smoke. Most people know smoking can cause heart and lung diseases, but did you know it also contributes to weakening of your bones? Aging of your skin?  Yellowing of your teeth and nails?  CALCIUM AND VITAMIN D:  Adequate intake of calcium and Vitamin D are recommended.  The recommendations for exact amounts of these supplements seem to change often, but generally speaking 1,200 mg of calcium (between diet and supplement) and 800 units of Vitamin D per day seems prudent. Certain women may benefit from higher intake of Vitamin D.  If you are among these women, your doctor will have told you during your visit.    PAP SMEARS:  Pap smears, to check for cervical cancer or precancers,  have traditionally been done yearly, although recent scientific advances have shown that most women can have pap smears less often.  However, every woman still should have a physical exam from her gynecologist every year. It will include a breast check, inspection of the vulva and vagina to check for abnormal growths or skin changes, a visual exam of the cervix, and then an exam to evaluate the size and shape of the uterus and  ovaries.  And after 66 years of age, a rectal exam is indicated to check for rectal cancers. We will also provide age appropriate advice regarding health maintenance, like when you should have certain vaccines, screening for sexually transmitted diseases, bone density testing, colonoscopy, mammograms, etc.   MAMMOGRAMS:  All women over 40 years old should have a yearly mammogram. Many facilities now offer a "3D" mammogram, which may cost around $50 extra out of pocket. If possible,  we recommend you accept the option to have the 3D mammogram performed.  It both reduces the number of women who will be called back for extra views which then turn out to be normal, and it is better than the routine mammogram at detecting truly abnormal areas.    COLON CANCER SCREENING: Now recommend starting at age 45. At this time colonoscopy is not covered for routine screening until 50. There are take home tests that can be done between 45-49.   COLONOSCOPY:  Colonoscopy to screen for colon cancer is recommended for all women at age 50.  We know, you hate the idea of the prep.  We agree, BUT, having colon cancer and not knowing it is worse!!  Colon cancer so often starts as a polyp that can be seen and removed at colonscopy, which can quite literally save your life!  And if your first colonoscopy is normal and you have no family history of colon cancer, most women don't have to have it again for   10 years.  Once every ten years, you can do something that may end up saving your life, right?  We will be happy to help you get it scheduled when you are ready.  Be sure to check your insurance coverage so you understand how much it will cost.  It may be covered as a preventative service at no cost, but you should check your particular policy.      Breast Self-Awareness Breast self-awareness means being familiar with how your breasts look and feel. It involves checking your breasts regularly and reporting any changes to your  health care provider. Practicing breast self-awareness is important. A change in your breasts can be a sign of a serious medical problem. Being familiar with how your breasts look and feel allows you to find any problems early, when treatment is more likely to be successful. All women should practice breast self-awareness, including women who have had breast implants. How to do a breast self-exam One way to learn what is normal for your breasts and whether your breasts are changing is to do a breast self-exam. To do a breast self-exam: Look for Changes  1. Remove all the clothing above your waist. 2. Stand in front of a mirror in a room with good lighting. 3. Put your hands on your hips. 4. Push your hands firmly downward. 5. Compare your breasts in the mirror. Look for differences between them (asymmetry), such as: ? Differences in shape. ? Differences in size. ? Puckers, dips, and bumps in one breast and not the other. 6. Look at each breast for changes in your skin, such as: ? Redness. ? Scaly areas. 7. Look for changes in your nipples, such as: ? Discharge. ? Bleeding. ? Dimpling. ? Redness. ? A change in position. Feel for Changes Carefully feel your breasts for lumps and changes. It is best to do this while lying on your back on the floor and again while sitting or standing in the shower or tub with soapy water on your skin. Feel each breast in the following way:  Place the arm on the side of the breast you are examining above your head.  Feel your breast with the other hand.  Start in the nipple area and make  inch (2 cm) overlapping circles to feel your breast. Use the pads of your three middle fingers to do this. Apply light pressure, then medium pressure, then firm pressure. The light pressure will allow you to feel the tissue closest to the skin. The medium pressure will allow you to feel the tissue that is a little deeper. The firm pressure will allow you to feel the tissue  close to the ribs.  Continue the overlapping circles, moving downward over the breast until you feel your ribs below your breast.  Move one finger-width toward the center of the body. Continue to use the  inch (2 cm) overlapping circles to feel your breast as you move slowly up toward your collarbone.  Continue the up and down exam using all three pressures until you reach your armpit.  Write Down What You Find  Write down what is normal for each breast and any changes that you find. Keep a written record with breast changes or normal findings for each breast. By writing this information down, you do not need to depend only on memory for size, tenderness, or location. Write down where you are in your menstrual cycle, if you are still menstruating. If you are having trouble noticing differences   in your breasts, do not get discouraged. With time you will become more familiar with the variations in your breasts and more comfortable with the exam. How often should I examine my breasts? Examine your breasts every month. If you are breastfeeding, the best time to examine your breasts is after a feeding or after using a breast pump. If you menstruate, the best time to examine your breasts is 5-7 days after your period is over. During your period, your breasts are lumpier, and it may be more difficult to notice changes. When should I see my health care provider? See your health care provider if you notice:  A change in shape or size of your breasts or nipples.  A change in the skin of your breast or nipples, such as a reddened or scaly area.  Unusual discharge from your nipples.  A lump or thick area that was not there before.  Pain in your breasts.  Anything that concerns you.  

## 2020-04-20 LAB — CYTOLOGY - PAP: Diagnosis: NEGATIVE

## 2020-04-29 DIAGNOSIS — Z79899 Other long term (current) drug therapy: Secondary | ICD-10-CM | POA: Diagnosis not present

## 2020-04-29 DIAGNOSIS — M25511 Pain in right shoulder: Secondary | ICD-10-CM | POA: Diagnosis not present

## 2020-04-29 DIAGNOSIS — G43909 Migraine, unspecified, not intractable, without status migrainosus: Secondary | ICD-10-CM | POA: Diagnosis not present

## 2020-05-29 DIAGNOSIS — G8929 Other chronic pain: Secondary | ICD-10-CM | POA: Diagnosis not present

## 2020-05-29 DIAGNOSIS — M25511 Pain in right shoulder: Secondary | ICD-10-CM | POA: Diagnosis not present

## 2020-05-29 DIAGNOSIS — M545 Low back pain, unspecified: Secondary | ICD-10-CM | POA: Diagnosis not present

## 2020-05-29 DIAGNOSIS — Z79899 Other long term (current) drug therapy: Secondary | ICD-10-CM | POA: Diagnosis not present

## 2020-06-29 DIAGNOSIS — M25511 Pain in right shoulder: Secondary | ICD-10-CM | POA: Diagnosis not present

## 2020-06-29 DIAGNOSIS — Z79899 Other long term (current) drug therapy: Secondary | ICD-10-CM | POA: Diagnosis not present

## 2020-06-29 DIAGNOSIS — G43909 Migraine, unspecified, not intractable, without status migrainosus: Secondary | ICD-10-CM | POA: Diagnosis not present

## 2020-07-27 DIAGNOSIS — M545 Low back pain, unspecified: Secondary | ICD-10-CM | POA: Diagnosis not present

## 2020-07-27 DIAGNOSIS — G8929 Other chronic pain: Secondary | ICD-10-CM | POA: Diagnosis not present

## 2020-07-27 DIAGNOSIS — Z79899 Other long term (current) drug therapy: Secondary | ICD-10-CM | POA: Diagnosis not present

## 2020-07-27 DIAGNOSIS — M25511 Pain in right shoulder: Secondary | ICD-10-CM | POA: Diagnosis not present

## 2020-08-26 DIAGNOSIS — M545 Low back pain, unspecified: Secondary | ICD-10-CM | POA: Diagnosis not present

## 2020-08-26 DIAGNOSIS — M25511 Pain in right shoulder: Secondary | ICD-10-CM | POA: Diagnosis not present

## 2020-08-26 DIAGNOSIS — Z79899 Other long term (current) drug therapy: Secondary | ICD-10-CM | POA: Diagnosis not present

## 2020-08-26 DIAGNOSIS — G8929 Other chronic pain: Secondary | ICD-10-CM | POA: Diagnosis not present

## 2020-09-24 DIAGNOSIS — L821 Other seborrheic keratosis: Secondary | ICD-10-CM | POA: Diagnosis not present

## 2020-09-24 DIAGNOSIS — L989 Disorder of the skin and subcutaneous tissue, unspecified: Secondary | ICD-10-CM | POA: Diagnosis not present

## 2020-09-25 DIAGNOSIS — Z79899 Other long term (current) drug therapy: Secondary | ICD-10-CM | POA: Diagnosis not present

## 2020-09-25 DIAGNOSIS — M25511 Pain in right shoulder: Secondary | ICD-10-CM | POA: Diagnosis not present

## 2020-10-23 DIAGNOSIS — M25511 Pain in right shoulder: Secondary | ICD-10-CM | POA: Diagnosis not present

## 2020-10-23 DIAGNOSIS — Z9109 Other allergy status, other than to drugs and biological substances: Secondary | ICD-10-CM | POA: Diagnosis not present

## 2020-10-23 DIAGNOSIS — Z79899 Other long term (current) drug therapy: Secondary | ICD-10-CM | POA: Diagnosis not present

## 2020-11-23 DIAGNOSIS — Z79899 Other long term (current) drug therapy: Secondary | ICD-10-CM | POA: Diagnosis not present

## 2020-11-23 DIAGNOSIS — M25511 Pain in right shoulder: Secondary | ICD-10-CM | POA: Diagnosis not present

## 2020-11-30 DIAGNOSIS — Z0000 Encounter for general adult medical examination without abnormal findings: Secondary | ICD-10-CM | POA: Diagnosis not present

## 2020-11-30 DIAGNOSIS — Z1159 Encounter for screening for other viral diseases: Secondary | ICD-10-CM | POA: Diagnosis not present

## 2020-11-30 DIAGNOSIS — R5383 Other fatigue: Secondary | ICD-10-CM | POA: Diagnosis not present

## 2020-11-30 DIAGNOSIS — Z79899 Other long term (current) drug therapy: Secondary | ICD-10-CM | POA: Diagnosis not present

## 2020-11-30 DIAGNOSIS — Z6826 Body mass index (BMI) 26.0-26.9, adult: Secondary | ICD-10-CM | POA: Diagnosis not present

## 2020-11-30 DIAGNOSIS — R0602 Shortness of breath: Secondary | ICD-10-CM | POA: Diagnosis not present

## 2020-11-30 DIAGNOSIS — E7800 Pure hypercholesterolemia, unspecified: Secondary | ICD-10-CM | POA: Diagnosis not present

## 2020-11-30 DIAGNOSIS — Z131 Encounter for screening for diabetes mellitus: Secondary | ICD-10-CM | POA: Diagnosis not present

## 2020-11-30 DIAGNOSIS — Z113 Encounter for screening for infections with a predominantly sexual mode of transmission: Secondary | ICD-10-CM | POA: Diagnosis not present

## 2020-12-02 DIAGNOSIS — R9431 Abnormal electrocardiogram [ECG] [EKG]: Secondary | ICD-10-CM | POA: Diagnosis not present

## 2020-12-17 DIAGNOSIS — R9431 Abnormal electrocardiogram [ECG] [EKG]: Secondary | ICD-10-CM | POA: Diagnosis not present

## 2020-12-23 DIAGNOSIS — Z79899 Other long term (current) drug therapy: Secondary | ICD-10-CM | POA: Diagnosis not present

## 2020-12-23 DIAGNOSIS — M25511 Pain in right shoulder: Secondary | ICD-10-CM | POA: Diagnosis not present

## 2021-01-22 DIAGNOSIS — Z79899 Other long term (current) drug therapy: Secondary | ICD-10-CM | POA: Diagnosis not present

## 2021-01-22 DIAGNOSIS — M25511 Pain in right shoulder: Secondary | ICD-10-CM | POA: Diagnosis not present

## 2021-02-23 DIAGNOSIS — M25511 Pain in right shoulder: Secondary | ICD-10-CM | POA: Diagnosis not present

## 2021-02-23 DIAGNOSIS — R5383 Other fatigue: Secondary | ICD-10-CM | POA: Diagnosis not present

## 2021-02-23 DIAGNOSIS — Z79899 Other long term (current) drug therapy: Secondary | ICD-10-CM | POA: Diagnosis not present

## 2021-02-23 DIAGNOSIS — E559 Vitamin D deficiency, unspecified: Secondary | ICD-10-CM | POA: Diagnosis not present

## 2021-03-29 DIAGNOSIS — M545 Low back pain, unspecified: Secondary | ICD-10-CM | POA: Diagnosis not present

## 2021-03-29 DIAGNOSIS — G8929 Other chronic pain: Secondary | ICD-10-CM | POA: Diagnosis not present

## 2021-03-29 DIAGNOSIS — Z79899 Other long term (current) drug therapy: Secondary | ICD-10-CM | POA: Diagnosis not present

## 2021-03-29 DIAGNOSIS — E2740 Unspecified adrenocortical insufficiency: Secondary | ICD-10-CM | POA: Diagnosis not present

## 2021-03-29 DIAGNOSIS — M25511 Pain in right shoulder: Secondary | ICD-10-CM | POA: Diagnosis not present

## 2021-04-05 DIAGNOSIS — Z1231 Encounter for screening mammogram for malignant neoplasm of breast: Secondary | ICD-10-CM | POA: Diagnosis not present

## 2021-04-21 ENCOUNTER — Ambulatory Visit: Payer: PPO | Admitting: Obstetrics and Gynecology

## 2021-04-29 DIAGNOSIS — G8929 Other chronic pain: Secondary | ICD-10-CM | POA: Diagnosis not present

## 2021-04-29 DIAGNOSIS — M545 Low back pain, unspecified: Secondary | ICD-10-CM | POA: Diagnosis not present

## 2021-04-29 DIAGNOSIS — Z79899 Other long term (current) drug therapy: Secondary | ICD-10-CM | POA: Diagnosis not present

## 2021-05-27 DIAGNOSIS — G250 Essential tremor: Secondary | ICD-10-CM | POA: Diagnosis not present

## 2021-05-27 DIAGNOSIS — G8929 Other chronic pain: Secondary | ICD-10-CM | POA: Diagnosis not present

## 2021-05-27 DIAGNOSIS — Z79899 Other long term (current) drug therapy: Secondary | ICD-10-CM | POA: Diagnosis not present

## 2021-05-27 DIAGNOSIS — M545 Low back pain, unspecified: Secondary | ICD-10-CM | POA: Diagnosis not present

## 2021-07-02 DIAGNOSIS — G250 Essential tremor: Secondary | ICD-10-CM | POA: Diagnosis not present

## 2021-07-02 DIAGNOSIS — M545 Low back pain, unspecified: Secondary | ICD-10-CM | POA: Diagnosis not present

## 2021-07-02 DIAGNOSIS — Z79899 Other long term (current) drug therapy: Secondary | ICD-10-CM | POA: Diagnosis not present

## 2021-07-02 DIAGNOSIS — G8929 Other chronic pain: Secondary | ICD-10-CM | POA: Diagnosis not present

## 2021-07-07 DIAGNOSIS — Z79899 Other long term (current) drug therapy: Secondary | ICD-10-CM | POA: Diagnosis not present

## 2021-08-02 DIAGNOSIS — G250 Essential tremor: Secondary | ICD-10-CM | POA: Diagnosis not present

## 2021-08-02 DIAGNOSIS — Z79899 Other long term (current) drug therapy: Secondary | ICD-10-CM | POA: Diagnosis not present

## 2021-08-02 DIAGNOSIS — G8929 Other chronic pain: Secondary | ICD-10-CM | POA: Diagnosis not present

## 2021-08-02 DIAGNOSIS — M545 Low back pain, unspecified: Secondary | ICD-10-CM | POA: Diagnosis not present

## 2021-08-05 DIAGNOSIS — Z79899 Other long term (current) drug therapy: Secondary | ICD-10-CM | POA: Diagnosis not present

## 2022-04-13 NOTE — Progress Notes (Deleted)
66 y.o. G4P4003 Widowed White or Caucasian Not Hispanic or Latino female here for annual exam.      Patient's last menstrual period was 06/22/2008 (approximate).          Sexually active: {yes no:314532}  The current method of family planning is {contraception:315051}.    Exercising: {yes no:314532}  {types:19826} Smoker:  {YES [id number]  Health Maintenance: Pap: 04/16/20 WNL  03/22/17 neg, no hpv testing.   History of abnormal Pap:  yes hx CIN 1  MMG:  04/29/20 Bi-rads 1 neg *** BMD:   2018 normal f/u 3 years  *** Colonoscopy: 2010 f/u 10 years*** TDaP:  09/23/2009 *** Gardasil: n/a   reports that she has never smoked. She has never used smokeless tobacco. She reports that she does not drink alcohol and does not use drugs.  Past Medical History:  Diagnosis Date   Abnormal Pap smear 08/2005   CIN I   Benign essential tremor 2008   Bilateral headaches    GERD (gastroesophageal reflux disease)    Peripheral vertigo     Past Surgical History:  Procedure Laterality Date   CESAREAN SECTION     x4   COLONOSCOPY  01/30/09   normal recxheck in 10 years   REVERSE SHOULDER ARTHROPLASTY Right 09/06/2018   Procedure: REVERSE SHOULDER ARTHROPLASTY;  Surgeon: Francena Hanly, MD;  Location: [facility];  Service: Orthopedics;  Laterality: Right;    Current Outpatient Medications  Medication Sig Dispense Refill   gabapentin (NEURONTIN) 100 MG capsule Take 100 mg by mouth 4 (four) times daily as needed.     oxyCODONE-acetaminophen (PERCOCET/ROXICET) 5-325 MG tablet Take 1 tablet by mouth every 4 (four) hours as needed for severe pain. 30 tablet 0   No current facility-administered medications for this visit.    Family History  Problem Relation Age of Onset   Coronary artery disease Father    Heart failure Father    Stroke Paternal Grandfather    Heart failure Maternal Grandmother     Review of Systems  Exam:   LMP 06/22/2008 (Approximate)   Weight change: @WEIGHTCHANGE @ Height:      Ht  Readings from Last 3 Encounters:  04/16/20 5' 2.25" (1.581 m)  04/03/19 5' 2.75" (1.594 m)  09/06/18 5\' 3"  (1.6 m)    General appearance: alert, cooperative and appears stated age Head: Normocephalic, without obvious abnormality, atraumatic Neck: no adenopathy, supple, symmetrical, trachea midline and thyroid {CHL AMB PHY EX THYROID NORM [phone]::"normal to inspection and palpation"} Lungs: clear to auscultation bilaterally Cardiovascular: regular rate and rhythm Breasts: {Exam; breast:13139::"normal appearance, no masses or tenderness"} Abdomen: soft, non-tender; non distended,  no masses,  no organomegaly Extremities: extremities normal, atraumatic, no cyanosis or edema Skin: Skin color, texture, turgor normal. No rashes or lesions Lymph nodes: Cervical, supraclavicular, and axillary nodes normal. No abnormal inguinal nodes palpated Neurologic: Grossly normal   Pelvic: External genitalia:  no lesions              Urethra:  normal appearing urethra with no masses, tenderness or lesions              Bartholins and Skenes: normal                 Vagina: normal appearing vagina with normal color and discharge, no lesions              Cervix: {CHL AMB PHY EX CERVIX NORM [phone]::"no lesions"}  Bimanual Exam:  Uterus:  {CHL AMB PHY EX UTERUS NORM DEFAULT:[phone]::"normal size, contour, position, consistency, mobility, non-tender"}              Adnexa: {CHL AMB PHY EX ADNEXA NO MASS DEFAULT:[phone]::"no mass, fullness, tenderness"}               Rectovaginal: Confirms               Anus:  normal sphincter tone, no lesions  *** chaperoned for the exam.  A:  Well Woman with normal exam  P:

## 2022-04-20 ENCOUNTER — Ambulatory Visit: Payer: PPO | Admitting: Obstetrics and Gynecology

## 2022-04-21 ENCOUNTER — Encounter: Payer: Self-pay | Admitting: Obstetrics and Gynecology

## 2022-06-08 NOTE — Progress Notes (Signed)
66 y.o. [id number] Widowed White or Caucasian Not Hispanic or Latino female here for annual exam.   She is sexually active, new partner in the last 8 months. Hadn't been sexually active for ~10 years. Really happy. She is experiencing vaginal dryness during intercourse. No pain with entry. Hasn't tried a lubricant yet.   No vaginal bleeding.  She is seeing a Insurance underwriter and is on Vesicare for OAB symptoms. Helping some.  No bowel issues.     Patient's last menstrual period was 06/22/2008 (approximate).          Sexually active: Yes.    The current method of family planning is post menopausal status.    Exercising: Yes.    The patient has a physically strenuous job, but has no regular exercise apart from work.  Smoker:  no  Health Maintenance: Pap:  04/16/20 WNL;  03/22/17 neg, no hpv testing.  Negative HPV testing in 7/16. History of abnormal Pap:  Yes CIN1 about 2013  MMG:  04/18/22 Bi-rads 1 neg  BMD:    2018 normal f/u 3 years  Colonoscopy: 2023 did colagaurd was neg.  TDaP:  09/23/09 Gardasil: n/a   reports that she has never smoked. She has never used smokeless tobacco. She reports that she does not drink alcohol and does not use drugs. Working  part time at the [employer].  3 kids, 6 grand children.  Past Medical History:  Diagnosis Date   Abnormal Pap smear 08/2005   CIN I   Benign essential tremor 2008   Bilateral headaches    GERD (gastroesophageal reflux disease)    Peripheral vertigo     Past Surgical History:  Procedure Laterality Date   CESAREAN SECTION     x4   COLONOSCOPY  01/30/09   normal recxheck in 10 years   REVERSE SHOULDER ARTHROPLASTY Right 09/06/2018   Procedure: REVERSE SHOULDER ARTHROPLASTY;  Surgeon: Francena Hanly, MD;  Location: [facility];  Service: Orthopedics;  Laterality: Right;    Current Outpatient Medications  Medication Sig Dispense Refill   oxyCODONE-acetaminophen (PERCOCET/ROXICET) 5-325 MG tablet Take 1 tablet by mouth every 4 (four) hours as  needed for severe pain. 30 tablet 0   solifenacin (VESICARE) 5 MG tablet Take 5 mg by mouth at bedtime.     trimethoprim (TRIMPEX) 100 MG tablet Take 100 mg by mouth at bedtime.     No current facility-administered medications for this visit.    Family History  Problem Relation Age of Onset   Coronary artery disease Father    Heart failure Father    Stroke Paternal Grandfather    Heart failure Maternal Grandmother     Review of Systems  Exam:   BP 132/70   Pulse 66   Ht 5\' 2"  (1.575 m)   Wt 119 lb (54 kg)   LMP 06/22/2008 (Approximate)   SpO2 100%   BMI 21.77 kg/m   Weight change: @WEIGHTCHANGE @ Height:   Height: 5\' 2"  (157.5 cm)  Ht Readings from Last 3 Encounters:  06/13/22 5\' 2"  (1.575 m)  04/16/20 5' 2.25" (1.581 m)  04/03/19 5' 2.75" (1.594 m)    General appearance: alert, cooperative and appears stated age Head: Normocephalic, without obvious abnormality, atraumatic Neck: no adenopathy, supple, symmetrical, trachea midline and thyroid normal to inspection and palpation Breasts: normal appearance, no masses or tenderness Abdomen: soft, non-tender; non distended,  no masses,  no organomegaly Extremities: extremities normal, atraumatic, no cyanosis or edema Skin: Skin color, texture, turgor normal. No rashes  or lesions Lymph nodes: Cervical, supraclavicular, and axillary nodes normal. No abnormal inguinal nodes palpated Neurologic: Grossly normal   Pelvic: External genitalia:  no lesions              Urethra:  normal appearing urethra with no masses, tenderness or lesions              Bartholins and Skenes: normal                 Vagina: mildly atrophic appearing vagina with normal color and discharge, no lesions              Cervix: no lesions and stenotic               Bimanual Exam:  Uterus:  normal size, contour, position, consistency, mobility, non-tender              Adnexa: no mass, fullness, tenderness               Rectovaginal: Confirms                Anus:  normal sphincter tone, no lesions  1. Encounter for breast and pelvic examination Discussed breast self exam Discussed calcium and vit D intake Labs and immunizations with primary Cologuard and mammogram UTD  2. Screening for cervical cancer - Cytology - PAP  3. Screening examination for STD (sexually transmitted disease) Declines blood work - SURESWAB CT/NG/T. vaginalis  4. Vaginal dryness Recommended vaginal lubricant, samples of uberlube given. She will call if she continues to have issues.

## 2022-06-13 ENCOUNTER — Ambulatory Visit (INDEPENDENT_AMBULATORY_CARE_PROVIDER_SITE_OTHER): Payer: PPO | Admitting: Obstetrics and Gynecology

## 2022-06-13 ENCOUNTER — Other Ambulatory Visit (HOSPITAL_COMMUNITY)
Admission: RE | Admit: 2022-06-13 | Discharge: 2022-06-13 | Disposition: A | Discharge status: Home / Self Care | Payer: PPO | Source: Ambulatory Visit | Attending: Obstetrics and Gynecology | Admitting: Obstetrics and Gynecology

## 2022-06-13 ENCOUNTER — Encounter: Payer: Self-pay | Admitting: Obstetrics and Gynecology

## 2022-06-13 VITALS — BP 132/70 | HR 66 | Ht 62.0 in | Wt 119.0 lb

## 2022-06-13 DIAGNOSIS — Z1151 Encounter for screening for human papillomavirus (HPV): Secondary | ICD-10-CM | POA: Diagnosis not present

## 2022-06-13 DIAGNOSIS — Z124 Encounter for screening for malignant neoplasm of cervix: Secondary | ICD-10-CM | POA: Insufficient documentation

## 2022-06-13 DIAGNOSIS — N898 Other specified noninflammatory disorders of vagina: Secondary | ICD-10-CM

## 2022-06-13 DIAGNOSIS — Z113 Encounter for screening for infections with a predominantly sexual mode of transmission: Secondary | ICD-10-CM

## 2022-06-13 DIAGNOSIS — Z9189 Other specified personal risk factors, not elsewhere classified: Secondary | ICD-10-CM | POA: Diagnosis not present

## 2022-06-13 DIAGNOSIS — Z01419 Encounter for gynecological examination (general) (routine) without abnormal findings: Secondary | ICD-10-CM

## 2022-06-13 NOTE — Patient Instructions (Signed)

## 2022-06-14 LAB — SURESWAB CT/NG/T. VAGINALIS
C. trachomatis RNA, TMA: NOT DETECTED
N. gonorrhoeae RNA, TMA: NOT DETECTED
Trichomonas vaginalis RNA: NOT DETECTED

## 2022-06-16 LAB — CYTOLOGY - PAP
Adequacy: ABSENT
Comment: NEGATIVE
Diagnosis: NEGATIVE
High risk HPV: NEGATIVE
